# Patient Record
Sex: Male | Born: 1945 | Race: Black or African American | Hispanic: No | Smoking: Former smoker
Health system: Southern US, Community
[De-identification: ages and names within clinical notes are randomized; demographics above are authoritative.]

## PROBLEM LIST (undated history)

## (undated) DIAGNOSIS — I10 Essential (primary) hypertension: Secondary | ICD-10-CM

## (undated) DIAGNOSIS — I5189 Other ill-defined heart diseases: Secondary | ICD-10-CM

## (undated) DIAGNOSIS — E559 Vitamin D deficiency, unspecified: Secondary | ICD-10-CM

## (undated) DIAGNOSIS — G4733 Obstructive sleep apnea (adult) (pediatric): Secondary | ICD-10-CM

## (undated) DIAGNOSIS — R7303 Prediabetes: Secondary | ICD-10-CM

## (undated) DIAGNOSIS — E039 Hypothyroidism, unspecified: Secondary | ICD-10-CM

## (undated) DIAGNOSIS — M549 Dorsalgia, unspecified: Secondary | ICD-10-CM

## (undated) DIAGNOSIS — N189 Chronic kidney disease, unspecified: Secondary | ICD-10-CM

## (undated) DIAGNOSIS — D509 Iron deficiency anemia, unspecified: Secondary | ICD-10-CM

## (undated) DIAGNOSIS — Z6834 Body mass index (BMI) 34.0-34.9, adult: Secondary | ICD-10-CM

## (undated) DIAGNOSIS — N529 Male erectile dysfunction, unspecified: Secondary | ICD-10-CM

## (undated) DIAGNOSIS — N281 Cyst of kidney, acquired: Secondary | ICD-10-CM

## (undated) DIAGNOSIS — C61 Malignant neoplasm of prostate: Secondary | ICD-10-CM

## (undated) DIAGNOSIS — I350 Nonrheumatic aortic (valve) stenosis: Secondary | ICD-10-CM

## (undated) DIAGNOSIS — B351 Tinea unguium: Secondary | ICD-10-CM

## (undated) DIAGNOSIS — I4430 Unspecified atrioventricular block: Secondary | ICD-10-CM

## (undated) DIAGNOSIS — I119 Hypertensive heart disease without heart failure: Secondary | ICD-10-CM

## (undated) DIAGNOSIS — H348192 Central retinal vein occlusion, unspecified eye, stable: Secondary | ICD-10-CM

## (undated) DIAGNOSIS — T7840XA Allergy, unspecified, initial encounter: Secondary | ICD-10-CM

## (undated) HISTORY — DX: Obstructive sleep apnea (adult) (pediatric): G47.33

## (undated) HISTORY — DX: Vitamin D deficiency, unspecified: E55.9

## (undated) HISTORY — DX: Hypertensive heart disease without heart failure: I11.9

## (undated) HISTORY — DX: Allergy, unspecified, initial encounter: T78.40XA

## (undated) HISTORY — DX: Essential (primary) hypertension: I10

## (undated) HISTORY — DX: Nonrheumatic aortic (valve) stenosis: I35.0

## (undated) HISTORY — DX: Other ill-defined heart diseases: I51.89

## (undated) HISTORY — DX: Body mass index (BMI) 34.0-34.9, adult: Z68.34

## (undated) HISTORY — DX: Dorsalgia, unspecified: M54.9

## (undated) HISTORY — DX: Chronic kidney disease, unspecified: N18.9

## (undated) HISTORY — DX: Hypothyroidism, unspecified: E03.9

## (undated) HISTORY — PX: KNEE SURGERY: SHX244

## (undated) HISTORY — DX: Central retinal vein occlusion, unspecified eye, stable: H34.8192

## (undated) HISTORY — DX: Cyst of kidney, acquired: N28.1

## (undated) HISTORY — DX: Tinea unguium: B35.1

## (undated) HISTORY — DX: Iron deficiency anemia, unspecified: D50.9

## (undated) HISTORY — DX: Male erectile dysfunction, unspecified: N52.9

## (undated) HISTORY — PX: OTHER SURGICAL HISTORY: SHX169

## (undated) HISTORY — DX: Prediabetes: R73.03

## (undated) HISTORY — DX: Unspecified atrioventricular block: I44.30

## (undated) HISTORY — PX: ANKLE SURGERY: SHX546

## (undated) HISTORY — DX: Malignant neoplasm of prostate: C61

## (undated) HISTORY — PX: FOOT SURGERY: SHX648

---

## 2019-05-13 HISTORY — PX: PACEMAKER IMPLANT: EP1218

## 2019-05-22 LAB — HM DIABETES EYE EXAM

## 2020-07-21 ENCOUNTER — Telehealth: Payer: Self-pay

## 2020-07-21 NOTE — Telephone Encounter (Signed)
NOTES ON FILE FROM [facility], SENT NOTES TO REFERRAL

## 2020-07-29 ENCOUNTER — Other Ambulatory Visit: Payer: Self-pay

## 2020-07-29 ENCOUNTER — Encounter: Payer: Self-pay | Admitting: Internal Medicine

## 2020-07-29 ENCOUNTER — Ambulatory Visit: Payer: Medicare Other | Admitting: Internal Medicine

## 2020-07-29 VITALS — BP 146/72 | HR 68 | Ht 76.0 in | Wt 296.2 lb

## 2020-07-29 DIAGNOSIS — R0989 Other specified symptoms and signs involving the circulatory and respiratory systems: Secondary | ICD-10-CM

## 2020-07-29 DIAGNOSIS — I441 Atrioventricular block, second degree: Secondary | ICD-10-CM

## 2020-07-29 DIAGNOSIS — I10 Essential (primary) hypertension: Secondary | ICD-10-CM

## 2020-07-29 LAB — CUP PACEART INCLINIC DEVICE CHECK
Battery Remaining Longevity: 139 mo
Battery Voltage: 3.02 V
Brady Statistic RA Percent Paced: 23 %
Brady Statistic RV Percent Paced: 9.8 %
Date Time Interrogation Session: 20210908134353
Implantable Lead Implant Date: 20200622
Implantable Lead Implant Date: 20200622
Implantable Lead Location: 753859
Implantable Lead Location: 753860
Implantable Pulse Generator Implant Date: 20200622
Lead Channel Impedance Value: 387.5 Ohm
Lead Channel Impedance Value: 637.5 Ohm
Lead Channel Pacing Threshold Amplitude: 0.625 V
Lead Channel Pacing Threshold Amplitude: 0.75 V
Lead Channel Pacing Threshold Pulse Width: 0.4 ms
Lead Channel Pacing Threshold Pulse Width: 0.5 ms
Lead Channel Sensing Intrinsic Amplitude: 2.7 mV
Lead Channel Sensing Intrinsic Amplitude: 5.8 mV
Lead Channel Setting Pacing Amplitude: 0.875
Lead Channel Setting Pacing Amplitude: 1.75 V
Lead Channel Setting Pacing Pulse Width: 0.5 ms
Lead Channel Setting Sensing Sensitivity: 2 mV
Pulse Gen Model: 2272
Pulse Gen Serial Number: 3311922

## 2020-07-29 NOTE — Patient Instructions (Addendum)
Medication Instructions:  Your physician recommends that you continue on your current medications as directed. Please refer to the Current Medication list given to you today.  *If you need a refill on your cardiac medications before your next appointment, please call your pharmacy*  Lab Work: None ordered.  If you have labs (blood work) drawn today and your tests are completely normal, you will receive your results only by: Marland Kitchen MyChart Message (if you have MyChart) OR . A paper copy in the mail If you have any lab test that is abnormal or we need to change your treatment, we will call you to review the results.  Testing/Procedures: Your physician has requested that you have a carotid duplex. This test is an ultrasound of the carotid arteries in your neck. It looks at blood flow through these arteries that supply the brain with blood. Allow one hour for this exam. There are no restrictions or special instructions.  Please schedule for carotid dopplers at [facility]    Follow-Up: At [facility], you and your health needs are our priority.  As part of our continuing mission to provide you with exceptional heart care, we have created designated Provider Care Teams.  These Care Teams include your primary Cardiologist (physician) and Advanced Practice Providers (APPs -  Physician Assistants and Nurse Practitioners) who all work together to provide you with the care you need, when you need it.  We recommend signing up for the patient portal called "MyChart".  Sign up information is provided on this After Visit Summary.  MyChart is used to connect with patients for Virtual Visits (Telemedicine).  Patients are able to view lab/test results, encounter notes, upcoming appointments, etc.  Non-urgent messages can be sent to your provider as well.   To learn more about what you can do with MyChart, go to [email or web address].    Your next appointment:   Your physician wants you to follow-up in: 3  months with Richardson Dopp and 1 year with Dr. Rayann Heman. You will receive a reminder letter in the mail two months in advance. If you don't receive a letter, please call our office to schedule the follow-up appointment.    Other Instructions:

## 2020-07-29 NOTE — Progress Notes (Signed)
Jared Chaney, No Pcp Per: Primary Cardiologist:  Previously Dr Carmela Rima in [place] is a 74 y.o. male with a h/o bradycardia sp PPM (SJM) in [place] on 05/13/19 who presents today to establish care in the Electrophysiology device clinic.  He reports having transient presyncope 1 week post TAVR with transient AV block documented to be the cause.  He underwent emergent PPM implant. The Jared Chaney reports doing very well since having a pacemaker implanted and remains very active despite his age.  He is has chronic diastolic dysfunction and is also s/p TAVR.    He has venous insuffiency/ edema.   Today, he  denies symptoms of palpitations, chest pain, shortness of breath, orthopnea, PND,  dizziness, presyncope, syncope, or neurologic sequela.  The patientis tolerating medications without difficulties and is otherwise without complaint today.   Past Medical History:  Diagnosis Date  . Allergies   . Aortic stenosis    s/p TAVR  . AV block    s/p PPM  . Back pain   . Body mass index (BMI) 34.0-34.9, adult   . Borderline diabetes   . CKD (chronic kidney disease)   . Diastolic dysfunction   . Erectile dysfunction   . Hypertension   . Hypothyroidism   . Iron deficiency anemia   . Kidney cysts   . LVH (left ventricular hypertrophy) due to hypertensive disease   . Onychomycosis of toenail   . OSA (obstructive sleep apnea)    not compliant with CPAP  . Prostate cancer ([place])   . Retinal vein occlusion   . Vitamin D deficiency    Past Surgical History:  Procedure Laterality Date  . ANKLE SURGERY    . FOOT SURGERY Right   . GASTRIC BY PASS    . KNEE SURGERY    . PACEMAKER IMPLANT Left 05/13/2019   SJM Assurity MRI model 2272 PPM implanted in [place] by Dr Emeline General for "AV block"  . SKIN TRANSPLANTATION    . TAVR      Social History   Socioeconomic History  . Marital status: Widowed    Spouse name: Not on file  . Number of children: Not on file  . Years of education: Not on  file  . Highest education level: Not on file  Occupational History  . Not on file  Tobacco Use  . Smoking status: Former Smoker    Packs/day: 0.50    Years: 24.00    Pack years: 12.00    Types: Cigarettes    Start date: 63    Quit date: 1984    Years since quitting: 37.7  . Smokeless tobacco: Never Used  Substance and Sexual Activity  . Alcohol use: Yes    Comment: 6pk per week  . Drug use: Not on file  . Sexual activity: Not on file  Other Topics Concern  . Not on file  Social History Narrative   Moved from [place] to  July 2021 to be near his son   Widowed (2015)   3 biological children and 7 adopted   Retired from [employer]   Social Determinants of Radio broadcast assistant Strain:   . Difficulty of Paying Living Expenses: Not on file  Food Insecurity:   . Worried About Charity fundraiser in the Last Year: Not on file  . Ran Out of Food in the Last Year: Not on file  Transportation Needs:   . Lack of Transportation (Medical): Not on file  .  Lack of Transportation (Non-Medical): Not on file  Physical Activity:   . Days of Exercise per Week: Not on file  . Minutes of Exercise per Session: Not on file  Stress:   . Feeling of Stress : Not on file  Social Connections:   . Frequency of Communication with Friends and Family: Not on file  . Frequency of Social Gatherings with Friends and Family: Not on file  . Attends Religious Services: Not on file  . Active Member of Clubs or Organizations: Not on file  . Attends Archivist Meetings: Not on file  . Marital Status: Not on file  Intimate Partner Violence:   . Fear of Current or Ex-Partner: Not on file  . Emotionally Abused: Not on file  . Physically Abused: Not on file  . Sexually Abused: Not on file    Family History  Problem Relation Age of Onset  . Hypertension Mother   . Heart attack Mother   . Hypertension Father   . Renal Disease Father   . CVA Other     Allergies  Allergen  Reactions  . Codeine     Current Outpatient Medications  Medication Sig Dispense Refill  . amLODipine (NORVASC) 10 MG tablet Take 10 mg by mouth daily.    Marland Kitchen amoxicillin (AMOXIL) 500 MG tablet Take 500 mg by mouth 2 (two) times daily.    Marland Kitchen aspirin EC 81 MG tablet Take 81 mg by mouth daily. Swallow whole.    . benazepril-hydrochlorthiazide (LOTENSIN HCT) 20-25 MG tablet Take 1 tablet by mouth daily.    . bicalutamide (CASODEX) 50 MG tablet Take 50 mg by mouth daily.    . fexofenadine (ALLEGRA) 180 MG tablet Take 180 mg by mouth as needed for allergies or rhinitis.    . fluticasone (FLONASE) 50 MCG/ACT nasal spray Place 1 spray into the nose in the morning and at bedtime.    Marland Kitchen FLUTICASONE PROPIONATE EX Apply topically.    Marland Kitchen levothyroxine (SYNTHROID) 75 MCG tablet Take 75 mcg by mouth daily before breakfast.    . omeprazole (PRILOSEC) 20 MG capsule Take 20 mg by mouth daily.    . Selenium 200 MCG CAPS Take 1 capsule by mouth daily.    . sildenafil (VIAGRA) 50 MG tablet Take 50 mg by mouth daily as needed for erectile dysfunction.    Marland Kitchen Specialty Vitamins Products [facility]) CAPS Take by mouth.    Marland Kitchen Specialty Vitamins Products (PROSTATE PO) Take by mouth.    . thiamine 100 MG tablet Take 100 mg by mouth daily.     No current facility-administered medications for this visit.    ROS- all systems are reviewed and negative except as per HPI  Physical Exam: Vitals:   07/29/20 1305  BP: (!) 146/72  Pulse: 68  SpO2: 96%  Weight: 296 lb 3.2 oz (134.4 kg)  Height: 6\' 4"  (1.93 m)    GEN- The Jared Chaney is well appearing, alert and oriented x 3 today.   Head- normocephalic, atraumatic Eyes-  Poor vision in R eye Ears- hearing intact Oropharynx- clear Neck- supple,   + bilateral bruits Lungs-   normal work of breathing Chest- pacemaker pocket is well healed Heart- Regular rate and rhythm  GI- soft  Extremities- no clubbing, cyanosis, or edema MS- no significant deformity or  atrophy Skin- no rash or lesion Psych- euthymic mood, full affect Neuro- strength and sensation are intact  Pacemaker interrogation- reviewed in detail today,  See PACEART report  Echo 01/23/20-  EF 50-55%, mild LVH, mild MR/TR/PR/AI, s/p TAVR  Assessment and Plan:  1. Second degree AV block Normal pacemaker function See paceArt  2. Chronic diastolic dysfunction Doing well No changes  3. S/p TAVR Stable No change required today  4. HTN Stable No change required today  5. OSA Uses CPAP rarely  6. Obesity Body mass index is 36.05 kg/m. S/p gastric bypass  7. Venous insuffiency Stable No change required today  8. Carotid bruits (bilateral) Obtain carotid dopplers  Return to see general cardiology team Richardson Dopp) every 3 months I will follow remotely and see in a year

## 2020-07-30 NOTE — Addendum Note (Signed)
Addended by: Rose Phi on: 07/30/2020 04:40 PM   Modules accepted: Orders

## 2020-08-11 ENCOUNTER — Ambulatory Visit (HOSPITAL_COMMUNITY)
Admission: RE | Admit: 2020-08-11 | Discharge: 2020-08-11 | Disposition: A | Discharge status: Home / Self Care | Payer: Medicare Other | Source: Ambulatory Visit | Attending: Cardiovascular Disease | Admitting: Cardiovascular Disease

## 2020-08-11 ENCOUNTER — Other Ambulatory Visit: Payer: Self-pay

## 2020-08-11 DIAGNOSIS — I10 Essential (primary) hypertension: Secondary | ICD-10-CM | POA: Insufficient documentation

## 2020-08-11 DIAGNOSIS — R0989 Other specified symptoms and signs involving the circulatory and respiratory systems: Secondary | ICD-10-CM | POA: Diagnosis not present

## 2020-08-11 DIAGNOSIS — I441 Atrioventricular block, second degree: Secondary | ICD-10-CM

## 2020-08-17 ENCOUNTER — Telehealth: Payer: Self-pay | Admitting: Internal Medicine

## 2020-08-17 NOTE — Telephone Encounter (Signed)
New Message  Pt is returning call for results   Call transferred to [facility]

## 2020-08-21 ENCOUNTER — Ambulatory Visit: Payer: Self-pay | Admitting: Family

## 2020-10-22 ENCOUNTER — Other Ambulatory Visit: Payer: Self-pay | Admitting: Urology

## 2020-10-22 ENCOUNTER — Other Ambulatory Visit (HOSPITAL_COMMUNITY): Payer: Self-pay | Admitting: Urology

## 2020-10-22 DIAGNOSIS — C7951 Secondary malignant neoplasm of bone: Secondary | ICD-10-CM

## 2020-10-22 DIAGNOSIS — C61 Malignant neoplasm of prostate: Secondary | ICD-10-CM

## 2020-10-22 DIAGNOSIS — C778 Secondary and unspecified malignant neoplasm of lymph nodes of multiple regions: Secondary | ICD-10-CM

## 2020-10-26 ENCOUNTER — Other Ambulatory Visit: Payer: Self-pay | Admitting: Family

## 2020-10-26 ENCOUNTER — Ambulatory Visit
Admission: RE | Admit: 2020-10-26 | Discharge: 2020-10-26 | Disposition: A | Discharge status: Home / Self Care | Payer: Medicare Other | Source: Ambulatory Visit | Attending: Family | Admitting: Family

## 2020-10-26 DIAGNOSIS — M25562 Pain in left knee: Secondary | ICD-10-CM
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
## 2020-10-28 ENCOUNTER — Other Ambulatory Visit: Payer: Self-pay

## 2020-10-28 ENCOUNTER — Ambulatory Visit (INDEPENDENT_AMBULATORY_CARE_PROVIDER_SITE_OTHER): Payer: Medicare Other

## 2020-10-28 ENCOUNTER — Encounter: Payer: Self-pay | Admitting: Physician Assistant

## 2020-10-28 ENCOUNTER — Ambulatory Visit: Payer: Medicare Other | Admitting: Physician Assistant

## 2020-10-28 VITALS — BP 100/42 | HR 70 | Ht 76.0 in | Wt 299.0 lb

## 2020-10-28 DIAGNOSIS — I5032 Chronic diastolic (congestive) heart failure: Secondary | ICD-10-CM

## 2020-10-28 DIAGNOSIS — N1831 Chronic kidney disease, stage 3a: Secondary | ICD-10-CM

## 2020-10-28 DIAGNOSIS — Z952 Presence of prosthetic heart valve: Secondary | ICD-10-CM

## 2020-10-28 DIAGNOSIS — I441 Atrioventricular block, second degree: Secondary | ICD-10-CM | POA: Diagnosis not present

## 2020-10-28 DIAGNOSIS — I350 Nonrheumatic aortic (valve) stenosis: Secondary | ICD-10-CM

## 2020-10-28 DIAGNOSIS — I2510 Atherosclerotic heart disease of native coronary artery without angina pectoris: Secondary | ICD-10-CM | POA: Diagnosis not present

## 2020-10-28 DIAGNOSIS — I10 Essential (primary) hypertension: Secondary | ICD-10-CM | POA: Diagnosis not present

## 2020-10-28 DIAGNOSIS — Z950 Presence of cardiac pacemaker: Secondary | ICD-10-CM | POA: Diagnosis not present

## 2020-10-28 NOTE — Patient Instructions (Signed)
Medication Instructions:  Your physician recommends that you continue on your current medications as directed. Please refer to the Current Medication list given to you today.  *If you need a refill on your cardiac medications before your next appointment, please call your pharmacy*  Lab Work: None ordered today  Testing/Procedures: None ordered today  Follow-Up: At [facility], you and your health needs are our priority.  As part of our continuing mission to provide you with exceptional heart care, we have created designated Provider Care Teams.  These Care Teams include your primary Cardiologist (physician) and Advanced Practice Providers (APPs -  Physician Assistants and Nurse Practitioners) who all work together to provide you with the care you need, when you need it.  Your next appointment:   6 month(s)  The format for your next appointment:   In Person  Provider:   Scott Weaver, PA-C   

## 2020-10-29 LAB — CUP PACEART REMOTE DEVICE CHECK
Battery Remaining Longevity: 133 mo
Battery Remaining Percentage: 95.5 %
Battery Voltage: 3.02 V
Brady Statistic AP VP Percent: 5.6 %
Brady Statistic AP VS Percent: 17 %
Brady Statistic AS VP Percent: 5.2 %
Brady Statistic AS VS Percent: 71 %
Brady Statistic RA Percent Paced: 21 %
Brady Statistic RV Percent Paced: 11 %
Date Time Interrogation Session: 20211208040012
Implantable Lead Implant Date: 20200622
Implantable Lead Implant Date: 20200622
Implantable Lead Location: 753859
Implantable Lead Location: 753860
Implantable Pulse Generator Implant Date: 20200622
Lead Channel Impedance Value: 390 Ohm
Lead Channel Impedance Value: 590 Ohm
Lead Channel Pacing Threshold Amplitude: 0.625 V
Lead Channel Pacing Threshold Amplitude: 0.75 V
Lead Channel Pacing Threshold Pulse Width: 0.4 ms
Lead Channel Pacing Threshold Pulse Width: 0.5 ms
Lead Channel Sensing Intrinsic Amplitude: 2.3 mV
Lead Channel Sensing Intrinsic Amplitude: 6.3 mV
Lead Channel Setting Pacing Amplitude: 0.875
Lead Channel Setting Pacing Amplitude: 1.75 V
Lead Channel Setting Pacing Pulse Width: 0.5 ms
Lead Channel Setting Sensing Sensitivity: 2 mV
Pulse Gen Model: 2272
Pulse Gen Serial Number: 3311922

## 2020-11-04 ENCOUNTER — Encounter (HOSPITAL_COMMUNITY): Payer: Medicare Other

## 2020-11-04 ENCOUNTER — Ambulatory Visit (HOSPITAL_COMMUNITY): Payer: Medicare Other

## 2020-11-04 ENCOUNTER — Ambulatory Visit: Payer: Medicare Other

## 2020-11-06 ENCOUNTER — Ambulatory Visit (HOSPITAL_COMMUNITY)
Admission: RE | Admit: 2020-11-06 | Discharge: 2020-11-06 | Disposition: A | Discharge status: Home / Self Care | Payer: Medicare Other | Source: Ambulatory Visit | Attending: Urology | Admitting: Urology

## 2020-11-06 ENCOUNTER — Other Ambulatory Visit: Payer: Self-pay

## 2020-11-06 DIAGNOSIS — C7951 Secondary malignant neoplasm of bone: Secondary | ICD-10-CM | POA: Diagnosis present

## 2020-11-06 DIAGNOSIS — C61 Malignant neoplasm of prostate: Secondary | ICD-10-CM | POA: Diagnosis present

## 2020-11-06 DIAGNOSIS — C778 Secondary and unspecified malignant neoplasm of lymph nodes of multiple regions: Secondary | ICD-10-CM | POA: Diagnosis present

## 2020-11-06 MED ORDER — TECHNETIUM TC 99M MEDRONATE IV KIT
21.7000 | PACK | Freq: Once | INTRAVENOUS | Status: AC
  Administered 2020-11-06: 21.7 via INTRAVENOUS

## 2020-11-09 ENCOUNTER — Ambulatory Visit (HOSPITAL_COMMUNITY): Payer: Medicare Other

## 2020-11-09 ENCOUNTER — Other Ambulatory Visit (HOSPITAL_COMMUNITY): Payer: Medicare Other

## 2020-11-10 NOTE — Progress Notes (Signed)
Remote pacemaker transmission.   

## 2020-11-19 ENCOUNTER — Other Ambulatory Visit (HOSPITAL_COMMUNITY): Payer: Medicare Other

## 2020-11-19 ENCOUNTER — Ambulatory Visit (HOSPITAL_COMMUNITY): Payer: Medicare Other

## 2020-11-25 ENCOUNTER — Ambulatory Visit: Payer: Medicare Other | Admitting: Family Medicine

## 2020-11-25 ENCOUNTER — Encounter: Payer: Self-pay | Admitting: Family Medicine

## 2020-11-25 ENCOUNTER — Other Ambulatory Visit: Payer: Self-pay

## 2020-11-25 VITALS — BP 152/100 | Ht 76.0 in | Wt 298.0 lb

## 2020-11-25 DIAGNOSIS — M6530 Trigger finger, unspecified finger: Secondary | ICD-10-CM | POA: Diagnosis not present

## 2020-11-25 DIAGNOSIS — M1711 Unilateral primary osteoarthritis, right knee: Secondary | ICD-10-CM | POA: Diagnosis not present

## 2020-11-25 MED ORDER — METHYLPREDNISOLONE ACETATE 40 MG/ML IJ SUSP
40.0000 mg | Freq: Once | INTRAMUSCULAR | Status: AC
  Administered 2020-11-25: 40 mg via INTRA_ARTICULAR

## 2020-11-25 MED ORDER — METHYLPREDNISOLONE ACETATE 40 MG/ML IJ SUSP
20.0000 mg | Freq: Once | INTRAMUSCULAR | Status: AC
  Administered 2020-11-25: 20 mg via INTRA_ARTICULAR

## 2020-11-25 NOTE — Progress Notes (Addendum)
PCP: [name], FNP  Subjective:   HPI: Patient is a 75 y.o. male with history of obesity status post bariatric surgery, known bilateral knee osteoarthritis, distant history of left medial meniscus surgery who presents here for evaluation of left knee pain and left finger pain.  Knee pain: Patient reports that he has known arthritis in his knee, and has had corticosteroid injection in the past, however over the last few months, it has progressively worsened.  He noted that it was hurting more the day after he was working out in [employer], though is not aware of any specific exercise that cause pain.  The pain is on the medial aspect of his knee around the medial joint line.  Aggravated by deep flexion or prolonged walking.  He does feel an intermittent catching sensation, but no true mechanical symptoms.  No numbness or tingling, no weakness.  He has intermittently taken Tylenol though does not like taking very many medications.  He was seen by his PCP for this issue, x-rays were obtained and showed severe medial compartmental arthritis.  Left fourth digit pain: Patient has noticed over the last year or so that his left fourth digit will get stuck in the flexed position.  Is then painful to try to straighten out eventually it will after force.  Sometimes he has to use his other hand to straighten out his finger and feels a popping sensation.  He has not had anything happen like this before.  He has not have any numbness or tingling or weakness that he is aware of.   Review of Systems:  Per HPI.   [place], medications and smoking status reviewed.      Objective:  Physical Exam:  No flowsheet data found.   Gen: awake, alert, NAD, comfortable in exam room Pulm: breathing unlabored  L Knee  Inspection: Bilateral knees without evidence of erythema, ecchymosis, swelling, edema. No significant effusion present  Active ROM: Intact. 0-140d with pain with deep flexion.  Strength: 5/5  strength to resisted flexion/extension without pain  Patella: Negative patellar grind. No patellar facet tenderness. No apprehension. No proximal or distal patellar tendon tenderness to palpation. No quad tendon tenderness to palpation.  Tibia: No tibial plateau, tibial tuberosity tenderness.  Joint line: + Medial joint line tenderness, mild Popliteal: No popliteal tenderness to the insertional gastroc. No insertional biceps femoris, semimembranosis, semitendinosis tenderness.  McMurrays/Thessaly's: Mildly painful along the medial joint line Lachmans: Stable bilaterally with firm endpoint  Anterior/Posterior drawer: Stable bilaterally Varus/valgus stress at 0, 15d: Negative for pain, laxity  Left fourth digit: Inspection: No obvious ecchymoses, edema or erythema Palpation: Tenderness to palpation just proximal to the fourth MCP joint with a palpable nodule present ROM: Full range of motion with flexion extension of the finger with reproduction of triggering on extension. Strength: 5/5 strength with flexion extension of the finger  Limited ultrasound examination of the left fourth digit shows mild enlargement of the flexor tendon just proximal to the MCP joint of the left fourth digit.    Assessment & Plan:  1.  Left knee medial compartmental osteoarthritis Patient with severe medial compartmental osteoarthritis and mild to moderate tricompartmental osteoarthritis.  Suspect that the medial compartment is exacerbated due to his history of meniscal surgery.  Discussed these findings with patient and discussed options including physical therapy, corticosteroid injection, viscosupplementation, surgery.  He would like to avoid surgery if at all possible.  Plan: -Corticosteroid injection performed today without complication -Home exercise plan given for  quadricep strengthening -Follow-up in 1 to 2 months for reevaluation  2.  Left fourth digit trigger finger Signs symptoms most consistent with  trigger finger and enlargement of the tendon visualized on ultrasound.  We discussed that corticosteroid injection or bracing would be the first step.  He elected for corticosteroid injection today which was performed without complication.  We will follow up in 1 to 2 months for reevaluation.  Procedure note: After informed written consent timeout was performed, patient was seated on exam table.  Left knee was prepped with alcohol swab and utilizing anteromedial approach, patient's left knee was injected intraarticularly with 3:1 bupivicaine: depomedrol. Patient tolerated the procedure well without immediate complications.  Procedure note: Verbal consent was obtained. Risks (including rare risk of infection, potential risk for skin lightening and potential atrophy), benefits and alternatives were discussed. Prepped with Chloraprep and Ethyl Chloride used for anesthesia. Under sterile conditions, ultrasound was used to identify the thickened tendon, and patient injected in this area.  This is all well visualized on ultrasound, please see associated documentation for details.   Needle size: 22 gauge 1 1/2 inch Injection: 1/2 cc of Lidocaine 1% and Depo-Medrol 20 mg   Jared Ligas, MD [facility] Fellow 11/25/2020 2:55 PM  Addendum:  Patient seen in the office by fellow.  His history, exam, plan of care were precepted with me.  Jared Lemon MD Kirt Boys

## 2020-11-25 NOTE — Addendum Note (Signed)
Addended by: Jolinda Croak E on: 11/25/2020 04:37 PM   Modules accepted: Orders

## 2021-01-01 ENCOUNTER — Encounter: Payer: Self-pay | Admitting: Family Medicine

## 2021-01-01 ENCOUNTER — Ambulatory Visit: Payer: Medicare Other | Admitting: Family Medicine

## 2021-01-01 ENCOUNTER — Other Ambulatory Visit: Payer: Self-pay

## 2021-01-01 VITALS — BP 135/65 | Ht 76.0 in | Wt 290.0 lb

## 2021-01-01 DIAGNOSIS — M6530 Trigger finger, unspecified finger: Secondary | ICD-10-CM | POA: Diagnosis not present

## 2021-01-01 DIAGNOSIS — M1712 Unilateral primary osteoarthritis, left knee: Secondary | ICD-10-CM | POA: Diagnosis not present

## 2021-01-01 NOTE — Progress Notes (Signed)
   PCP: [name], FNP  Subjective:   HPI: Patient is a 75 y.o. male with known bilateral severe knee osteoarthritis here for reevaluation his knee and left fourth digit trigger finger.  I last saw him for this on 11/25/2020, at that time he was having classic left fourth digit trigger finger and worsening left knee osteoarthritis.  He received a trigger finger injection and left knee corticosteroid injection.  Trigger finger, left fourth digit Patient reports he has had significant improvement in his symptoms, and in fact is not having any triggering or pain anymore.  Feels back to normal.  No numbness or tingling, no weakness, no new concerns.  Left knee osteoarthritis Patient with known bilateral knee osteoarthritis, most severe in the medial compartments.  At last visit, he was having primarily medial knee pain.  After the corticosteroid injection, he reports that he did have significant improvement for very brief time (1 to 2 weeks) but the pain recurred.  He has been trying to stay active and walking but he is unable to do much weightbearing activity due to the pain.  He has not had any new trauma or injury since the last visit.  He does note that he has had success with hyaluronic acid injections in his right knee and wonders if he can have this on the left.  Review of Systems:  Per HPI.   [place], medications and smoking status reviewed.      Objective:  Physical Exam:  No flowsheet data found.   Gen: awake, alert, NAD, comfortable in exam room Pulm: breathing unlabored  L Knee  Inspection: Bilateral knees without evidence of erythema, ecchymosis, swelling, edema. No significant effusion present  Active ROM: Intact. 0-140d with pain with deep flexion.  Strength: 5/5 strength to resisted flexion/extension without pain  Patella: Negative patellar grind. No patellar facet tenderness. No apprehension. No proximal or distal patellar tendon tenderness to palpation. No quad tendon  tenderness to palpation.  Tibia: No tibial plateau, tibial tuberosity tenderness.  Joint line: + Medial joint line tenderness, mild Popliteal: No popliteal tenderness to the insertional gastroc. No insertional biceps femoris, semimembranosis, semitendinosis tenderness.  McMurrays/Thessaly's: Mildly painful along the medial joint line  Left fourth digit: Inspection: No obvious ecchymoses, edema or erythema Palpation: Nontender to palpation, no palpable nodule. ROM: Full range of motion with flexion extension of the finger without triggering. Strength: 5/5 strength with flexion extension of the finger   Assessment & Plan:  1.  Primary localized left knee medial compartmental osteoarthritis Patient with continued symptoms despite corticosteroid injection and conservative therapy with quad strengthening exercises.  He is wondering about viscosupplementation.  We discussed that there is a highly variable response, some people having benefit some people having no benefit at all.  Patient voiced understanding and would like to proceed.  Given failure of other conservative therapies, along with his past success with viscosupplementation on the right knee, we will plan to proceed with viscosupplementation.  We will contact his insurance company for prior authorization and have him back once this is authorized to start viscosupplementation.  Dagoberto Ligas, MD [facility] 01/01/2021 9:24 AM

## 2021-01-13 ENCOUNTER — Encounter: Payer: Self-pay | Admitting: Family Medicine

## 2021-01-13 ENCOUNTER — Other Ambulatory Visit: Payer: Self-pay

## 2021-01-13 ENCOUNTER — Ambulatory Visit: Payer: Medicare Other | Admitting: Family Medicine

## 2021-01-13 VITALS — BP 128/64 | Ht 76.0 in | Wt 290.0 lb

## 2021-01-13 DIAGNOSIS — M1712 Unilateral primary osteoarthritis, left knee: Secondary | ICD-10-CM

## 2021-01-13 NOTE — Progress Notes (Addendum)
   PCP: [name], FNP  Subjective:   HPI: Patient is a 75 y.o. male with history of obesity status post bariatric surgery, known bilateral knee osteoarthritis, distant history of left medial meniscus surgery who presents here for  left knee viscosupplementation injection.  He reports that his knee pain is unchanged since his previous visit on 11/25/2020.  He has no new concerns and would still like to proceed with viscosupplementation today.  Review of Systems:  Per HPI.   [place], medications and smoking status reviewed.      Objective:  Physical Exam:  No flowsheet data found.   Gen: awake, alert, NAD, comfortable in exam room Pulm: breathing unlabored  L Knee  Inspection: Bilateral knees without evidence of erythema, ecchymosis, swelling, edema. No significant effusion present  Active ROM: Intact. 0-140d with pain with deep flexion.  Strength: 5/5 strength to resisted flexion/extension without pain  Joint line: + Medial joint line tenderness Lachmans: Stable bilaterally with firm endpoint  Anterior/Posterior drawer: Stable bilaterally Varus/valgus stress at 0, 15d: Negative for pain, laxity   Assessment & Plan:  1.  Left knee osteoarthritis  Patient presents for viscosupplementation injection.  This was performed today under ultrasound guidance successfully.  We will plan to follow-up as needed based on symptoms.  Discussed that he should give this at least a month to evaluate its effectiveness.  Procedure note: Patient is clinical status is marked by significant pain and/or functional disability.  Other conservative therapies have not provide adequate relief or not indicated.  Risks and benefits of left knee viscosupplementation injection have been discussed with the patient and he elected to proceed.  Timeout was called prior to the procedure and correct location was identified.  Patient was then cleaned with alcohol swab, and sterile ultrasound technique was used to  identify the suprapatellar pouch.  He was anesthetized with 3 cc of lidocaine, and injected with preloaded Durolane syringe.  This is all well visualized under ultrasound guidance.   Dagoberto Ligas, MD [facility] 01/13/2021 3:52 PM  Addendum:  I was the preceptor for this visit and available for immediate consultation.  Karlton Lemon MD Kirt Boys

## 2021-01-27 ENCOUNTER — Ambulatory Visit (INDEPENDENT_AMBULATORY_CARE_PROVIDER_SITE_OTHER): Payer: Medicare Other

## 2021-01-27 DIAGNOSIS — I441 Atrioventricular block, second degree: Secondary | ICD-10-CM | POA: Diagnosis not present

## 2021-01-29 LAB — CUP PACEART REMOTE DEVICE CHECK
Battery Remaining Longevity: 132 mo
Battery Remaining Percentage: 95.5 %
Battery Voltage: 3.01 V
Brady Statistic AP VP Percent: 8.5 %
Brady Statistic AP VS Percent: 19 %
Brady Statistic AS VP Percent: 5.3 %
Brady Statistic AS VS Percent: 67 %
Brady Statistic RA Percent Paced: 25 %
Brady Statistic RV Percent Paced: 14 %
Date Time Interrogation Session: 20220309040027
Implantable Lead Implant Date: 20200622
Implantable Lead Implant Date: 20200622
Implantable Lead Location: 753859
Implantable Lead Location: 753860
Implantable Pulse Generator Implant Date: 20200622
Lead Channel Impedance Value: 390 Ohm
Lead Channel Impedance Value: 590 Ohm
Lead Channel Pacing Threshold Amplitude: 0.625 V
Lead Channel Pacing Threshold Amplitude: 0.875 V
Lead Channel Pacing Threshold Pulse Width: 0.4 ms
Lead Channel Pacing Threshold Pulse Width: 0.5 ms
Lead Channel Sensing Intrinsic Amplitude: 2.6 mV
Lead Channel Sensing Intrinsic Amplitude: 5.6 mV
Lead Channel Setting Pacing Amplitude: 0.875
Lead Channel Setting Pacing Amplitude: 1.875
Lead Channel Setting Pacing Pulse Width: 0.5 ms
Lead Channel Setting Sensing Sensitivity: 2 mV
Pulse Gen Model: 2272
Pulse Gen Serial Number: 3311922

## 2021-02-04 NOTE — Progress Notes (Signed)
Remote pacemaker transmission.   

## 2021-04-27 NOTE — Progress Notes (Signed)
Cardiology Office Note:    Date:  04/28/2021   ID:  Jared Chaney, DOB [birth year], MRN [id number]  PCP:  Jared Brasil, FNP   [facility] Providers Cardiologist:  None Cardiology APP:  Liliane Shi, PA-C  Electrophysiologist:  Thompson Grayer, MD     Referring MD: Jared Brasil, FNP   Chief Complaint:  Follow-up (Hx of TAVR, CAD)    Patient Profile:    Jared Chaney is a 75 y.o. male with:   Heart failure with preserved ejection fraction   Aortic stenosis ? 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 SCr 1.86 (03/04/20)  Non-obs coronary artery disease (cath in 04/2019)  Diabetes mellitus   Hypothyroidism   Macular degeneration   Prostate CA  Hx of gastric bypass in 2008   Carotid artery disease  ? bilat ICA 1-39  Prior CV studies: Carotid US 08/11/20 bilat ICA 1-39  Echocardiogram 01/23/20 ([place], [place]) EF 50-55, mild LVH, no RWMA, mild AI, normal TAVR gradient, mild MR, mild TR, mild PI  Cardiac catheterization 04/22/2019 ([place], [place]) LAD mid 40 LCx prox 10 RCA no CAD   History of Present Illness: Jared Chaney returns for follow-up.  He is here alone.  He does note symptoms of exhaustion.  This usually occurs after doing laborious work.  He has noted this for quite some time.  There has not been any significant worsening.  He has not had chest pain, shortness of breath, syncope.  He does have a history of sleep apnea.  He has not been able to tolerate CPAP therapy.  He has frequent urination at night given his history of prostate cancer and does not get much sleep.        Past Medical History:  Diagnosis Date  . Allergies   . Aortic stenosis    s/p TAVR  . AV block    s/p PPM  . Back pain   . Body mass index (BMI) 34.0-34.9, adult   . Borderline diabetes   . CKD (chronic kidney disease)   . Diastolic dysfunction   . Erectile dysfunction   .  Hypertension   . Hypothyroidism   . Iron deficiency anemia   . Kidney cysts   . LVH (left ventricular hypertrophy) due to hypertensive disease   . Onychomycosis of toenail   . OSA (obstructive sleep apnea)    not compliant with CPAP  . Prostate cancer ([place])   . Retinal vein occlusion   . Vitamin D deficiency     Current Medications: Current Meds  Medication Sig  . amLODipine (NORVASC) 10 MG tablet Take 10 mg by mouth daily.  Marland Kitchen amoxicillin (AMOXIL) 500 MG tablet Take 500 mg by mouth 2 (two) times daily.  Marland Kitchen aspirin EC 81 MG tablet Take 81 mg by mouth daily. Swallow whole.  Marland Kitchen azithromycin (ZITHROMAX) 250 MG tablet Take 250 mg by mouth as needed (for protection against infections/bacteria).   . benazepril-hydrochlorthiazide (LOTENSIN HCT) 20-25 MG tablet Take 1 tablet by mouth daily.  . bicalutamide (CASODEX) 50 MG tablet Take 50 mg by mouth daily.  . fexofenadine (ALLEGRA) 180 MG tablet Take 180 mg by mouth as needed for allergies or rhinitis.  . fluticasone (FLONASE) 50 MCG/ACT nasal spray Place 1 spray into the nose in the morning and at bedtime.  Marland Kitchen FLUTICASONE PROPIONATE EX Apply topically.  Marland Kitchen levothyroxine (SYNTHROID) 75  MCG tablet Take 75 mcg by mouth daily before breakfast.  . omeprazole (PRILOSEC) 20 MG capsule Take 20 mg by mouth daily.  . Selenium 200 MCG CAPS Take 1 capsule by mouth daily.  . sildenafil (VIAGRA) 50 MG tablet Take 50 mg by mouth daily as needed for erectile dysfunction.  Marland Kitchen Specialty Vitamins Products [facility]) CAPS Take by mouth.  Marland Kitchen Specialty Vitamins Products (PROSTATE PO) Take by mouth.  . thiamine 100 MG tablet Take 100 mg by mouth daily.     Allergies:   Codeine   Social History   Tobacco Use  . Smoking status: Former Smoker    Packs/day: 0.50    Years: 24.00    Pack years: 12.00    Types: Cigarettes    Start date: 70    Quit date: 1984    Years since quitting: 38.4  . Smokeless tobacco: Never Used  Substance Use Topics  . Alcohol use:  Yes    Comment: 6pk per week     Family Hx: The patient's family history includes CVA in an other family member; Heart attack in his mother; Hypertension in his father and mother; Renal Disease in his father.  Review of Systems  Respiratory: Positive for snoring.      EKGs/Labs/Other Test Reviewed:    EKG:  EKG is not ordered today.  The ekg ordered today demonstrates n/a  Recent Labs: No results found for requested labs within last 8760 hours.   Recent Lipid Panel No results found for: CHOL, TRIG, HDL, LDLCALC, LDLDIRECT    Risk Assessment/Calculations:      Physical Exam:    VS:  BP 120/68   Pulse 75   Ht 6\' 4"  (1.93 m)   Wt 292 lb (132.5 kg)   SpO2 98%   BMI 35.54 kg/m     Wt Readings from Last 3 Encounters:  04/28/21 292 lb (132.5 kg)  01/13/21 290 lb (131.5 kg)  01/01/21 290 lb (131.5 kg)     Constitutional:      Appearance: Healthy appearance. Not in distress.  Neck:     Vascular: JVD normal.  Pulmonary:     Effort: Pulmonary effort is normal.     Breath sounds: No wheezing. No rales.  Cardiovascular:     Normal rate. Regular rhythm. Normal S1. Normal S2.     Murmurs: There is a grade 2/6 systolic murmur at the URSB.  Edema:    Ankle: bilateral trace edema of the ankle. Abdominal:     Palpations: Abdomen is soft. There is no hepatomegaly.  Skin:    General: Skin is warm and dry.  Neurological:     General: No focal deficit present.     Mental Status: Alert and oriented to person, place and time.     Cranial Nerves: Cranial nerves are intact.         ASSESSMENT & PLAN:    1. Chronic heart failure with preserved ejection fraction (HCC) Normal EF.  NYHA II.  Volume status stable.  He is only on a thiazide diuretic.  Continue current management.    2. Nonrheumatic aortic valve stenosis 3. S/P TAVR (transcatheter aortic valve replacement) Normally functioning TAVR on echocardiogram in 3/21.  Continue SBE prophylaxis.    4. Coronary artery  disease involving native coronary artery of native heart without angina pectoris Mild non-obs disease on cath in 2020 prior to TAVR.  He is not having angina.  He is fatigued.  I have asked him to contact us  if his symptoms worsen or if he has persistent symptoms despite resuming CPAP Rx (see below).  If symptoms continue or worsen, we could consider stress testing.  Continue ASA.  Obtain fasting lipids today.  Goal LDL <70.  I will have a low threshold to start statin Rx.   5. Essential hypertension The patient's blood pressure is controlled on his current regimen.  Continue current therapy.    6. Stage 3a chronic kidney disease (HCC) Recent Cr 1.5.  7. Cardiac pacemaker in situ F/u with EP as planned.   8. OSA (obstructive sleep apnea) 9. Other fatigue He has a history of sleep apnea but has been unable to tolerate CPAP therapy.  I suspect his fatigue is mainly related to this.  I will refer him to Dr. Radford Pax to see if there is anything else that can be done to help him tolerate CPAP.  I will check with his PCP to see if he has had a recent hemoglobin and TSH obtained.  If not I will obtain a CBC and TSH today.  I have asked him to continue to monitor his blood pressure at home.  If it is running low, we can cut back on some of his medicines.    Dispo:  Return in about 9 months (around 01/26/2022) for Routine Follow Up, w/ Richardson Dopp, PA-C.   Medication Adjustments/Labs and Tests Ordered: Current medicines are reviewed at length with the patient today.  Concerns regarding medicines are outlined above.  Tests Ordered: Orders Placed This Encounter  Procedures  . Lipid Profile  . TSH  . CBC  . Ambulatory referral to Sleep Studies   Medication Changes: No orders of the defined types were placed in this encounter.   Signed, Richardson Dopp, PA-C  04/28/2021 10:29 AM    [facility] [place], [place], [place]  [id number] Phone: [phone]; Fax: [phone]

## 2021-04-28 ENCOUNTER — Other Ambulatory Visit: Payer: Self-pay

## 2021-04-28 ENCOUNTER — Encounter: Payer: Self-pay | Admitting: Physician Assistant

## 2021-04-28 ENCOUNTER — Telehealth: Payer: Self-pay | Admitting: *Deleted

## 2021-04-28 ENCOUNTER — Ambulatory Visit (INDEPENDENT_AMBULATORY_CARE_PROVIDER_SITE_OTHER): Payer: Medicare Other

## 2021-04-28 ENCOUNTER — Ambulatory Visit (INDEPENDENT_AMBULATORY_CARE_PROVIDER_SITE_OTHER): Payer: Medicare Other | Admitting: Physician Assistant

## 2021-04-28 VITALS — BP 120/68 | HR 75 | Ht 76.0 in | Wt 292.0 lb

## 2021-04-28 DIAGNOSIS — G4733 Obstructive sleep apnea (adult) (pediatric): Secondary | ICD-10-CM

## 2021-04-28 DIAGNOSIS — I441 Atrioventricular block, second degree: Secondary | ICD-10-CM

## 2021-04-28 DIAGNOSIS — Z952 Presence of prosthetic heart valve: Secondary | ICD-10-CM | POA: Diagnosis not present

## 2021-04-28 DIAGNOSIS — I350 Nonrheumatic aortic (valve) stenosis: Secondary | ICD-10-CM

## 2021-04-28 DIAGNOSIS — Z950 Presence of cardiac pacemaker: Secondary | ICD-10-CM

## 2021-04-28 DIAGNOSIS — R5383 Other fatigue: Secondary | ICD-10-CM

## 2021-04-28 DIAGNOSIS — I5032 Chronic diastolic (congestive) heart failure: Secondary | ICD-10-CM | POA: Diagnosis not present

## 2021-04-28 DIAGNOSIS — I10 Essential (primary) hypertension: Secondary | ICD-10-CM

## 2021-04-28 DIAGNOSIS — I2510 Atherosclerotic heart disease of native coronary artery without angina pectoris: Secondary | ICD-10-CM | POA: Diagnosis not present

## 2021-04-28 DIAGNOSIS — N1831 Chronic kidney disease, stage 3a: Secondary | ICD-10-CM

## 2021-04-28 LAB — CUP PACEART REMOTE DEVICE CHECK
Battery Remaining Longevity: 132 mo
Battery Remaining Percentage: 95.5 %
Battery Voltage: 3.01 V
Brady Statistic AP VP Percent: 8.7 %
Brady Statistic AP VS Percent: 18 %
Brady Statistic AS VP Percent: 5.5 %
Brady Statistic AS VS Percent: 66 %
Brady Statistic RA Percent Paced: 24 %
Brady Statistic RV Percent Paced: 14 %
Date Time Interrogation Session: 20220608040015
Implantable Lead Implant Date: 20200622
Implantable Lead Implant Date: 20200622
Implantable Lead Location: 753859
Implantable Lead Location: 753860
Implantable Pulse Generator Implant Date: 20200622
Lead Channel Impedance Value: 360 Ohm
Lead Channel Impedance Value: 560 Ohm
Lead Channel Pacing Threshold Amplitude: 0.625 V
Lead Channel Pacing Threshold Amplitude: 0.75 V
Lead Channel Pacing Threshold Pulse Width: 0.4 ms
Lead Channel Pacing Threshold Pulse Width: 0.5 ms
Lead Channel Sensing Intrinsic Amplitude: 2.4 mV
Lead Channel Sensing Intrinsic Amplitude: 5.9 mV
Lead Channel Setting Pacing Amplitude: 0.875
Lead Channel Setting Pacing Amplitude: 1.75 V
Lead Channel Setting Pacing Pulse Width: 0.5 ms
Lead Channel Setting Sensing Sensitivity: 2 mV
Pulse Gen Model: 2272
Pulse Gen Serial Number: 3311922

## 2021-04-28 LAB — CBC
Hematocrit: 34.2 % — ABNORMAL LOW (ref 37.5–51.0)
Hemoglobin: 11.6 g/dL — ABNORMAL LOW (ref 13.0–17.7)
MCH: 32.4 pg (ref 26.6–33.0)
MCHC: 33.9 g/dL (ref 31.5–35.7)
MCV: 96 fL (ref 79–97)
Platelets: 188 10*3/uL (ref 150–450)
RBC: 3.58 x10E6/uL — ABNORMAL LOW (ref 4.14–5.80)
RDW: 12.7 % (ref 11.6–15.4)
WBC: 5.1 10*3/uL (ref 3.4–10.8)

## 2021-04-28 LAB — LIPID PANEL
Chol/HDL Ratio: 4.8 ratio (ref 0.0–5.0)
Cholesterol, Total: 145 mg/dL (ref 100–199)
HDL: 30 mg/dL — ABNORMAL LOW (ref >39)
LDL Chol Calc (NIH): 95 mg/dL (ref 0–99)
Triglycerides: 108 mg/dL (ref 0–149)
VLDL Cholesterol Cal: 20 mg/dL (ref 5–40)

## 2021-04-28 LAB — TSH: TSH: 3.4 u[IU]/mL (ref 0.450–4.500)

## 2021-04-28 NOTE — Patient Instructions (Addendum)
Medication Instructions:   Your physician recommends that you continue on your current medications as directed. Please refer to the Current Medication list given to you today.  *If you need a refill on your cardiac medications before your next appointment, please call your pharmacy*   Lab Work: TODAY LIPID/TSH/CBC  If you have labs (blood work) drawn today and your tests are completely normal, you will receive your results only by: Marland Kitchen MyChart Message (if you have MyChart) OR . A paper copy in the mail If you have any lab test that is abnormal or we need to change your treatment, we will call you to review the results.   Testing/Procedures: NONE   Follow-Up: At [facility], you and your health needs are our priority.  As part of our continuing mission to provide you with exceptional heart care, we have created designated Provider Care Teams.  These Care Teams include your primary Cardiologist (physician) and Advanced Practice Providers (APPs -  Physician Assistants and Nurse Practitioners) who all work together to provide you with the care you need, when you need it.  We recommend signing up for the patient portal called "MyChart".  Sign up information is provided on this After Visit Summary.  MyChart is used to connect with patients for Virtual Visits (Telemedicine).  Patients are able to view lab/test results, encounter notes, upcoming appointments, etc.  Non-urgent messages can be sent to your provider as well.   To learn more about what you can do with MyChart, go to [email or web address].    Your next appointment:   9 month(s)  The format for your next appointment:   In Person  Provider:   Richardson Dopp, PA-C   Other Instructions Your physician wants you to follow-up in: 9 month with Richardson Dopp, PA.  You will receive a reminder letter in the mail two months in advance. If you don't receive a letter, please call our office to schedule the follow-up appointment.   Keep  Follow up with Dr. Rayann Heman in August.    You have been referred to Dr. Radford Pax to see if can do something about your CPAP.

## 2021-04-28 NOTE — Telephone Encounter (Signed)
  Patient Consent for Virtual Visit         Jared Chaney has provided verbal consent on 04/28/2021 for a virtual visit (video or telephone).   CONSENT FOR VIRTUAL VISIT FOR:  Jared Chaney  By participating in this virtual visit I agree to the following:  I hereby voluntarily request, consent and authorize [facility] and its employed or contracted physicians, physician assistants, nurse practitioners or other licensed health care professionals (the Practitioner), to provide me with telemedicine health care services (the "Services") as deemed necessary by the treating Practitioner. I acknowledge and consent to receive the Services by the Practitioner via telemedicine. I understand that the telemedicine visit will involve communicating with the Practitioner through live audiovisual communication technology and the disclosure of certain medical information by electronic transmission. I acknowledge that I have been given the opportunity to request an in-person assessment or other available alternative prior to the telemedicine visit and am voluntarily participating in the telemedicine visit.  I understand that I have the right to withhold or withdraw my consent to the use of telemedicine in the course of my care at any time, without affecting my right to future care or treatment, and that the Practitioner or I may terminate the telemedicine visit at any time. I understand that I have the right to inspect all information obtained and/or recorded in the course of the telemedicine visit and may receive copies of available information for a reasonable fee.  I understand that some of the potential risks of receiving the Services via telemedicine include:  Marland Kitchen Delay or interruption in medical evaluation due to technological equipment failure or disruption; . Information transmitted may not be sufficient (e.g. poor resolution of images) to allow for appropriate medical decision making by the  Practitioner; and/or  . In rare instances, security protocols could fail, causing a breach of personal health information.  Furthermore, I acknowledge that it is my responsibility to provide information about my medical history, conditions and care that is complete and accurate to the best of my ability. I acknowledge that Practitioner's advice, recommendations, and/or decision may be based on factors not within their control, such as incomplete or inaccurate data provided by me or distortions of diagnostic images or specimens that may result from electronic transmissions. I understand that the practice of medicine is not an exact science and that Practitioner makes no warranties or guarantees regarding treatment outcomes. I acknowledge that a copy of this consent can be made available to me via my patient portal ([place]), or I can request a printed copy by calling the office of [place].    I understand that my insurance will be billed for this visit.   I have read or had this consent read to me. . I understand the contents of this consent, which adequately explains the benefits and risks of the Services being provided via telemedicine.  . I have been provided ample opportunity to ask questions regarding this consent and the Services and have had my questions answered to my satisfaction. . I give my informed consent for the services to be provided through the use of telemedicine in my medical care

## 2021-04-30 ENCOUNTER — Other Ambulatory Visit: Payer: Self-pay | Admitting: *Deleted

## 2021-04-30 DIAGNOSIS — I2510 Atherosclerotic heart disease of native coronary artery without angina pectoris: Secondary | ICD-10-CM

## 2021-04-30 DIAGNOSIS — I5032 Chronic diastolic (congestive) heart failure: Secondary | ICD-10-CM

## 2021-04-30 MED ORDER — ROSUVASTATIN CALCIUM 10 MG PO TABS: 10.0000 mg | ORAL_TABLET | Freq: Every day | ORAL | 3 refills | Status: DC

## 2021-05-07 ENCOUNTER — Telehealth (INDEPENDENT_AMBULATORY_CARE_PROVIDER_SITE_OTHER): Payer: Medicare Other | Admitting: Cardiology

## 2021-05-07 ENCOUNTER — Other Ambulatory Visit: Payer: Self-pay

## 2021-05-07 VITALS — BP 138/67 | HR 69 | Ht 76.0 in | Wt 290.0 lb

## 2021-05-07 DIAGNOSIS — Z6835 Body mass index (BMI) 35.0-35.9, adult: Secondary | ICD-10-CM

## 2021-05-07 DIAGNOSIS — G4733 Obstructive sleep apnea (adult) (pediatric): Secondary | ICD-10-CM

## 2021-05-07 DIAGNOSIS — I10 Essential (primary) hypertension: Secondary | ICD-10-CM

## 2021-05-07 NOTE — Progress Notes (Signed)
Virtual Visit via Video Note   This visit type was conducted due to national recommendations for restrictions regarding the COVID-19 Pandemic (e.g. social distancing) in an effort to limit this patient's exposure and mitigate transmission in our community.  Due to his co-morbid illnesses, this patient is at least at moderate risk for complications without adequate follow up.  This format is felt to be most appropriate for this patient at this time.  All issues noted in this document were discussed and addressed.  A limited physical exam was performed with this format.  Please refer to the patient's chart for his consent to telehealth for [facility].   Date:  05/07/2021   ID:  Jared Chaney, DOB 18-Nov-1946, MRN [id number] The patient was identified using 2 identifiers.  Patient Location: Home Provider Location: Home Office   PCP:  [name], FNP   [facility] Providers Cardiologist:  None Cardiology APP:  Liliane Shi, PA-C  Electrophysiologist:  Thompson Grayer, MD     Evaluation Performed:  Follow-Up Visit  Chief Complaint:  OSA  History of Present Illness:    Jared Chaney is a 75 y.o. male with a hx of severe AS s/p TAVR, HTN, AVB s/p PPM and OSA on CPAP but intolerant who was recently seen by Richardson Dopp, PA and due to complaints of not tolerating his CPAP he was referred to me.  He tells me that he had gastric bypass surgery in 2008 and lost 140lbs and stopped using his PAP device.    In 2015 his wife passed and he was sleeping by himself and did not notice that he was snoring.  He then had a partner and was told that he snores really bad.  He talked with his PCP and had a sleep study done and was placed back on CPAP therapy.  This was when he was in [place] and moved to [place] this past year.    He says that he never really liked the PAP mask but would wear it.  Once he moved here he stopped using the device because he was no longer sleeping with anyone that he could  annoy with his snoring.  He says that the last time he used his device was over a year ago.  He says that he has prostate Ca and has had XRT and hormone tx and he gets up very frequently during the night to urinate so he does not sleep well at baseline and feels tired in the am.    The patient does not have symptoms concerning for COVID-19 infection (fever, chills, cough, or new shortness of breath).    Past Medical History:  Diagnosis Date   Allergies    Aortic stenosis    s/p TAVR   AV block    s/p PPM   Back pain    Body mass index (BMI) 34.0-34.9, adult    Borderline diabetes    CKD (chronic kidney disease)    Diastolic dysfunction    Erectile dysfunction    Hypertension    Hypothyroidism    Iron deficiency anemia    Kidney cysts    LVH (left ventricular hypertrophy) due to hypertensive disease    Onychomycosis of toenail    OSA (obstructive sleep apnea)    not compliant with CPAP   Prostate cancer ([place])    Retinal vein occlusion    Vitamin D deficiency    Past Surgical History:  Procedure Laterality Date   ANKLE SURGERY  FOOT SURGERY Right    GASTRIC BY PASS     KNEE SURGERY     PACEMAKER IMPLANT Left 05/13/2019   SJM Assurity MRI model 2272 PPM implanted in [place] by Dr Emeline General for "AV block"   SKIN TRANSPLANTATION     TAVR       Current Meds  Medication Sig   amLODipine (NORVASC) 10 MG tablet Take 10 mg by mouth daily.   amoxicillin (AMOXIL) 500 MG tablet Take 500 mg by mouth as directed. Only piror to dental procedures   aspirin EC 81 MG tablet Take 81 mg by mouth daily. Swallow whole.   azithromycin (ZITHROMAX) 250 MG tablet Take 250 mg by mouth as needed (for protection against infections/bacteria).    benazepril-hydrochlorthiazide (LOTENSIN HCT) 20-25 MG tablet Take 1 tablet by mouth daily.   bicalutamide (CASODEX) 50 MG tablet Take 50 mg by mouth daily.   fexofenadine (ALLEGRA) 180 MG tablet Take 180 mg by mouth as needed for allergies or rhinitis.    fluticasone (FLONASE) 50 MCG/ACT nasal spray Place 1 spray into the nose in the morning and at bedtime.   FLUTICASONE PROPIONATE EX Apply topically.   levothyroxine (SYNTHROID) 75 MCG tablet Take 75 mcg by mouth daily before breakfast.   omeprazole (PRILOSEC) 20 MG capsule Take 20 mg by mouth daily.   rosuvastatin (CRESTOR) 10 MG tablet Take 1 tablet (10 mg total) by mouth daily.   Selenium 200 MCG CAPS Take 1 capsule by mouth daily.   sildenafil (VIAGRA) 50 MG tablet Take 50 mg by mouth daily as needed for erectile dysfunction.   Specialty Vitamins Products [facility]) CAPS Take by mouth.   Specialty Vitamins Products (PROSTATE PO) Take by mouth.   thiamine 100 MG tablet Take 100 mg by mouth daily.     Allergies:   Codeine   Social History   Tobacco Use   Smoking status: Former    Packs/day: 0.50    Years: 24.00    Pack years: 12.00    Types: Cigarettes    Start date: 3    Quit date: 1984    Years since quitting: 38.4   Smokeless tobacco: Never  Substance Use Topics   Alcohol use: Yes    Comment: 6pk per week     Family Hx: The patient's family history includes CVA in an other family member; Heart attack in his mother; Hypertension in his father and mother; Renal Disease in his father.  ROS:   Please see the history of present illness.     All other systems reviewed and are negative.   Prior CV studies:   The following studies were reviewed today:  None  Labs/Other Tests and Data Reviewed:    EKG:  No ECG reviewed.  Recent Labs: 04/28/2021: Hemoglobin 11.6; Platelets 188; TSH 3.400   Recent Lipid Panel Lab Results  Component Value Date/Time   CHOL 145 04/28/2021 10:28 AM   TRIG 108 04/28/2021 10:28 AM   HDL 30 (L) 04/28/2021 10:28 AM   CHOLHDL 4.8 04/28/2021 10:28 AM   LDLCALC 95 04/28/2021 10:28 AM    Wt Readings from Last 3 Encounters:  05/07/21 290 lb (131.5 kg)  04/28/21 292 lb (132.5 kg)  01/13/21 290 lb (131.5 kg)     Risk  Assessment/Calculations:          Objective:    Vital Signs:  BP 138/67   Pulse 69   Ht 6\' 4"  (1.93 m)   Wt 290 lb (131.5 kg)  BMI 35.30 kg/m    VITAL SIGNS:  reviewed GEN:  no acute distress EYES:  sclerae anicteric, EOMI - Extraocular Movements Intact RESPIRATORY:  normal respiratory effort, symmetric expansion CARDIOVASCULAR:  no peripheral edema SKIN:  no rash, lesions or ulcers. MUSCULOSKELETAL:  no obvious deformities. NEURO:  alert and oriented x 3, no obvious focal deficit PSYCH:  normal affect  ASSESSMENT & PLAN:    OSA -he was on CPAP therapy prior to his gastric bypass surgery in 2008 and then stopped using it in 2015 as he was sleeping by himself and was not bothering anyone with his snoring -he had a sleep study done 2 years ago and went back on CPAP due to snoring but recently moved to [facility] and stopped using it a year ago -he feels tired all the time because he gets up frequently at night due to his prostate CA -He tells me that he did not tolerate the mask in the past.  It was a full face mask and was never tried on a Nasal mask or nasal pillow mask.  -unfortunately he is not a candidate for the Inspire device due to his obesity with BMI > 32 -I will get him set up for a split night sleep study and try him with a nasal mask with chin strap  2.  HTN -BP is adequately controlled on exam today -Continue prescription drug management with Amlodipine 10mg  daily, Benazepril-HCT 20-25mg  daily  3.  Morbid Obesity -I have encouraged him to get into a routine exercise program and cut back on carbs and portions.    COVID-19 Education: The signs and symptoms of COVID-19 were discussed with the patient and how to seek care for testing (follow up with PCP or arrange E-visit).  The importance of social distancing was discussed today.  Time:   Today, I have spent 20 minutes with the patient with telehealth technology discussing the above problems.     Medication  Adjustments/Labs and Tests Ordered: Current medicines are reviewed at length with the patient today.  Concerns regarding medicines are outlined above.   Tests Ordered: No orders of the defined types were placed in this encounter.   Medication Changes: No orders of the defined types were placed in this encounter.   Follow Up:  Virtual Visit  PRN after sleep study  Signed, Fransico Him, MD  05/07/2021 10:14 AM    [place]

## 2021-05-21 NOTE — Progress Notes (Signed)
Remote pacemaker transmission.   

## 2021-05-27 ENCOUNTER — Telehealth: Payer: Self-pay | Admitting: *Deleted

## 2021-05-27 DIAGNOSIS — G4733 Obstructive sleep apnea (adult) (pediatric): Secondary | ICD-10-CM

## 2021-05-27 NOTE — Telephone Encounter (Signed)
-----   Message from Lauralee Evener, [place] sent at 05/12/2021  8:52 AM EDT ----- Regarding: FW: Sleep study This is a [employer] patient. ----- Message ----- From: Mendel Ryder, CMA Sent: 05/11/2021  12:04 PM EDT To: Lauralee Evener, CMA Subject: FW: Sleep study                                Sorry, patient is attached now.  ----- Message ----- From: Lauralee Evener, CMA Sent: 05/10/2021   2:15 PM EDT To: Mendel Ryder, CMA Subject: RE: Sleep study                                There is no patient attached to this request. ----- Message ----- From: Mendel Ryder, CMA Sent: 05/10/2021  11:17 AM EDT To: Freada Bergeron, CMA, [facility] Sleep Studies Subject: Sleep study                                    Pt needs to have a sleep study.  Dx: OSA  Thank you

## 2021-05-27 NOTE — Telephone Encounter (Signed)
Prior Authorization for SPLIT NIGHT sent to [facility] via web portal.  Prior Authorization is not required for the requested services  Decision ID #:[id number] .

## 2021-05-31 NOTE — Telephone Encounter (Signed)
Patient is scheduled for lab study on 08/10/21. Patient understands his sleep study will be done at [facility] sleep lab. Patient understands he will receive a sleep packet in a week or so. Patient understands to call if he does not receive the sleep packet in a timely manner. Patient agrees with treatment and thanked me for call.

## 2021-07-28 ENCOUNTER — Ambulatory Visit (INDEPENDENT_AMBULATORY_CARE_PROVIDER_SITE_OTHER): Payer: Medicare Other

## 2021-07-28 DIAGNOSIS — I441 Atrioventricular block, second degree: Secondary | ICD-10-CM

## 2021-07-28 LAB — CUP PACEART REMOTE DEVICE CHECK
Battery Remaining Longevity: 105 mo
Battery Remaining Percentage: 83 %
Battery Voltage: 3.01 V
Brady Statistic AP VP Percent: 9 %
Brady Statistic AP VS Percent: 19 %
Brady Statistic AS VP Percent: 5.5 %
Brady Statistic AS VS Percent: 65 %
Brady Statistic RA Percent Paced: 24 %
Brady Statistic RV Percent Paced: 14 %
Date Time Interrogation Session: 20220907042058
Implantable Lead Implant Date: 20200622
Implantable Lead Implant Date: 20200622
Implantable Lead Location: 753859
Implantable Lead Location: 753860
Implantable Pulse Generator Implant Date: 20200622
Lead Channel Impedance Value: 350 Ohm
Lead Channel Impedance Value: 530 Ohm
Lead Channel Pacing Threshold Amplitude: 0.625 V
Lead Channel Pacing Threshold Amplitude: 0.875 V
Lead Channel Pacing Threshold Pulse Width: 0.4 ms
Lead Channel Pacing Threshold Pulse Width: 0.5 ms
Lead Channel Sensing Intrinsic Amplitude: 1.9 mV
Lead Channel Sensing Intrinsic Amplitude: 5.2 mV
Lead Channel Setting Pacing Amplitude: 1.125
Lead Channel Setting Pacing Amplitude: 1.625
Lead Channel Setting Pacing Pulse Width: 0.5 ms
Lead Channel Setting Sensing Sensitivity: 2 mV
Pulse Gen Model: 2272
Pulse Gen Serial Number: 3311922

## 2021-08-02 ENCOUNTER — Ambulatory Visit: Payer: Medicare Other | Admitting: Internal Medicine

## 2021-08-02 ENCOUNTER — Encounter: Payer: Self-pay | Admitting: Internal Medicine

## 2021-08-02 ENCOUNTER — Other Ambulatory Visit: Payer: Self-pay

## 2021-08-02 VITALS — BP 132/78 | HR 70 | Ht 76.0 in | Wt 283.0 lb

## 2021-08-02 DIAGNOSIS — Z952 Presence of prosthetic heart valve: Secondary | ICD-10-CM | POA: Diagnosis not present

## 2021-08-02 DIAGNOSIS — R0989 Other specified symptoms and signs involving the circulatory and respiratory systems: Secondary | ICD-10-CM

## 2021-08-02 DIAGNOSIS — I441 Atrioventricular block, second degree: Secondary | ICD-10-CM | POA: Diagnosis not present

## 2021-08-02 DIAGNOSIS — I872 Venous insufficiency (chronic) (peripheral): Secondary | ICD-10-CM

## 2021-08-02 DIAGNOSIS — I10 Essential (primary) hypertension: Secondary | ICD-10-CM | POA: Diagnosis not present

## 2021-08-02 DIAGNOSIS — G4733 Obstructive sleep apnea (adult) (pediatric): Secondary | ICD-10-CM

## 2021-08-02 DIAGNOSIS — I5032 Chronic diastolic (congestive) heart failure: Secondary | ICD-10-CM

## 2021-08-02 NOTE — Patient Instructions (Addendum)
Medication Instructions:  Your physician recommends that you continue on your current medications as directed. Please refer to the Current Medication list given to you today.  Labwork: None ordered.  Testing/Procedures: None ordered.  Follow-Up: Your physician wants you to follow-up in: 12 months with  one of the following Advanced Practice Providers on your designated Care Team:    Legrand Como "Jonni Sanger" Chalmers Cater, [place]   You will receive a reminder letter in the mail two months in advance. If you don't receive a letter, please call our office to schedule the follow-up appointment.  Remote monitoring is used to monitor your Pacemaker from home. This monitoring reduces the number of office visits required to check your device to one time per year. It allows [place] to keep an eye on the functioning of your device to ensure it is working properly. You are scheduled for a device check from home on 10/27/21. You may send your transmission at any time that day. If you have a wireless device, the transmission will be sent automatically. After your physician reviews your transmission, you will receive a postcard with your next transmission date.  Any Other Special Instructions Will Be Listed Below (If Applicable).  If you need a refill on your cardiac medications before your next appointment, please call your pharmacy.

## 2021-08-02 NOTE — Progress Notes (Signed)
PCP: [name], FNP Primary Cardiologist: Previously Dr Carmela Rima in [facility] Primary EP:  Dr Rayann Heman  Jared Chaney is a 75 y.o. male who presents today for routine electrophysiology followup.  Since last being seen in our clinic, the patient reports doing very well.  Today, he denies symptoms of palpitations, chest pain, shortness of breath,  lower extremity edema, dizziness, presyncope, or syncope.  The patient is otherwise without complaint today.   Past Medical History:  Diagnosis Date   Allergies    Aortic stenosis    s/p TAVR   AV block    s/p PPM   Back pain    Body mass index (BMI) 34.0-34.9, adult    Borderline diabetes    CKD (chronic kidney disease)    Diastolic dysfunction    Erectile dysfunction    Hypertension    Hypothyroidism    Iron deficiency anemia    Kidney cysts    LVH (left ventricular hypertrophy) due to hypertensive disease    Onychomycosis of toenail    OSA (obstructive sleep apnea)    not compliant with CPAP   Prostate cancer ([place])    Retinal vein occlusion    Vitamin D deficiency    Past Surgical History:  Procedure Laterality Date   ANKLE SURGERY     FOOT SURGERY Right    GASTRIC BY PASS     KNEE SURGERY     PACEMAKER IMPLANT Left 05/13/2019   SJM Assurity MRI model 2272 PPM implanted in [place] by Dr Emeline General for "AV block"   SKIN TRANSPLANTATION     TAVR      ROS- all systems are reviewed and negative except as per HPI above  Current Outpatient Medications  Medication Sig Dispense Refill   amLODipine (NORVASC) 10 MG tablet Take 10 mg by mouth daily.     amoxicillin (AMOXIL) 500 MG tablet Take 500 mg by mouth as directed. Only piror to dental procedures     aspirin EC 81 MG tablet Take 81 mg by mouth daily. Swallow whole.     azithromycin (ZITHROMAX) 250 MG tablet Take 250 mg by mouth as needed (for protection against infections/bacteria).      benazepril-hydrochlorthiazide (LOTENSIN HCT) 20-25 MG tablet Take 1 tablet by mouth daily.      bicalutamide (CASODEX) 50 MG tablet Take 50 mg by mouth daily.     fexofenadine (ALLEGRA) 180 MG tablet Take 180 mg by mouth as needed for allergies or rhinitis.     fluticasone (FLONASE) 50 MCG/ACT nasal spray Place 1 spray into the nose in the morning and at bedtime.     FLUTICASONE PROPIONATE EX Apply topically.     levothyroxine (SYNTHROID) 75 MCG tablet Take 75 mcg by mouth daily before breakfast.     omeprazole (PRILOSEC) 20 MG capsule Take 20 mg by mouth daily.     Selenium 200 MCG CAPS Take 1 capsule by mouth daily.     sildenafil (VIAGRA) 50 MG tablet Take 50 mg by mouth daily as needed for erectile dysfunction.     Specialty Vitamins Products [facility]) CAPS Take by mouth.     Specialty Vitamins Products (PROSTATE PO) Take by mouth.     thiamine 100 MG tablet Take 100 mg by mouth daily.     rosuvastatin (CRESTOR) 10 MG tablet Take 1 tablet (10 mg total) by mouth daily. 90 tablet 3   No current facility-administered medications for this visit.    Physical Exam: Vitals:  08/02/21 1436  BP: 132/78  Pulse: 70  SpO2: 98%  Weight: 283 lb (128.4 kg)  Height: 6\' 4"  (1.93 m)    GEN- The patient is well appearing, alert and oriented x 3 today.   Head- normocephalic, atraumatic Eyes-  Sclera clear, conjunctiva pink Ears- hearing intact Oropharynx- clear Lungs- Clear to ausculation bilaterally, normal work of breathing Chest- pacemaker pocket is well healed Heart- Regular rate and rhythm, no murmurs, rubs or gallops, PMI not laterally displaced GI- soft, NT, ND, + BS Extremities- no clubbing, cyanosis, or edema  Pacemaker interrogation- reviewed in detail today,  See PACEART report  ekg tracing ordered today is personally reviewed and shows sinus with first degree AV block, RBBB, LAHB  Assessment and Plan:  1. Symptomatic second degree heart block Normal pacemaker function See Pace Art report He was having PMT.  Dynamic PVARP turned off today.  PVARP extended to 350  msec. he is not device dependant today  2. HTN Stable No change required today  3. Chronic diastolic dysfunction Stable No change required today  4. S/p TAVR Stable No change required today  5. OSA He is compliant with CPAP  6. Obesity Body mass index is 34.45 kg/m. Lifestyle modification is advised  Follow-up with general cardiology as scheduled Return to see EP APP annually  Thompson Grayer MD, [facility] 08/02/2021 2:46 PM

## 2021-08-05 NOTE — Progress Notes (Signed)
Remote pacemaker transmission.   

## 2021-08-09 ENCOUNTER — Other Ambulatory Visit: Payer: Self-pay

## 2021-08-09 ENCOUNTER — Other Ambulatory Visit: Payer: Medicare Other | Admitting: *Deleted

## 2021-08-09 DIAGNOSIS — I5032 Chronic diastolic (congestive) heart failure: Secondary | ICD-10-CM

## 2021-08-09 DIAGNOSIS — I2510 Atherosclerotic heart disease of native coronary artery without angina pectoris: Secondary | ICD-10-CM

## 2021-08-09 LAB — HEPATIC FUNCTION PANEL
ALT: 20 IU/L (ref 0–44)
AST: 27 IU/L (ref 0–40)
Albumin: 4 g/dL (ref 3.7–4.7)
Alkaline Phosphatase: 80 IU/L (ref 44–121)
Bilirubin Total: 0.3 mg/dL (ref 0.0–1.2)
Bilirubin, Direct: 0.14 mg/dL (ref 0.00–0.40)
Total Protein: 6.5 g/dL (ref 6.0–8.5)

## 2021-08-09 LAB — LIPID PANEL
Chol/HDL Ratio: 2.6 ratio (ref 0.0–5.0)
Cholesterol, Total: 73 mg/dL — ABNORMAL LOW (ref 100–199)
HDL: 28 mg/dL — ABNORMAL LOW (ref >39)
LDL Chol Calc (NIH): 29 mg/dL (ref 0–99)
Triglycerides: 70 mg/dL (ref 0–149)
VLDL Cholesterol Cal: 16 mg/dL (ref 5–40)

## 2021-08-10 ENCOUNTER — Encounter (HOSPITAL_BASED_OUTPATIENT_CLINIC_OR_DEPARTMENT_OTHER): Payer: Medicare Other | Admitting: Cardiology

## 2021-08-16 ENCOUNTER — Other Ambulatory Visit: Payer: Self-pay | Admitting: Nephrology

## 2021-08-16 DIAGNOSIS — N184 Chronic kidney disease, stage 4 (severe): Secondary | ICD-10-CM

## 2021-08-17 ENCOUNTER — Ambulatory Visit
Admission: RE | Admit: 2021-08-17 | Discharge: 2021-08-17 | Disposition: A | Discharge status: Home / Self Care | Payer: Medicare Other | Source: Ambulatory Visit | Attending: Nephrology | Admitting: Nephrology

## 2021-08-17 DIAGNOSIS — N184 Chronic kidney disease, stage 4 (severe): Secondary | ICD-10-CM

## 2021-08-23 ENCOUNTER — Other Ambulatory Visit (HOSPITAL_COMMUNITY): Payer: Self-pay | Admitting: *Deleted

## 2021-08-24 ENCOUNTER — Encounter (HOSPITAL_COMMUNITY)
Admission: RE | Admit: 2021-08-24 | Discharge: 2021-08-24 | Disposition: A | Discharge status: Home / Self Care | Payer: Medicare Other | Source: Ambulatory Visit | Attending: Nephrology | Admitting: Nephrology

## 2021-08-24 ENCOUNTER — Other Ambulatory Visit: Payer: Self-pay

## 2021-08-24 ENCOUNTER — Encounter (HOSPITAL_COMMUNITY): Payer: Medicare Other

## 2021-08-24 DIAGNOSIS — D631 Anemia in chronic kidney disease: Secondary | ICD-10-CM | POA: Insufficient documentation

## 2021-08-24 DIAGNOSIS — N189 Chronic kidney disease, unspecified: Secondary | ICD-10-CM | POA: Diagnosis present

## 2021-08-24 MED ORDER — SODIUM CHLORIDE 0.9 % IV SOLN
510.0000 mg | INTRAVENOUS | Status: DC
  Administered 2021-08-24: 510 mg via INTRAVENOUS
  Filled 2021-08-24: qty 510

## 2021-08-31 ENCOUNTER — Encounter (HOSPITAL_COMMUNITY): Payer: Medicare Other

## 2021-09-01 ENCOUNTER — Encounter (HOSPITAL_COMMUNITY)
Admission: RE | Admit: 2021-09-01 | Discharge: 2021-09-01 | Disposition: A | Discharge status: Home / Self Care | Payer: Medicare Other | Source: Ambulatory Visit | Attending: Nephrology | Admitting: Nephrology

## 2021-09-01 ENCOUNTER — Encounter (HOSPITAL_COMMUNITY): Payer: Medicare Other

## 2021-09-01 DIAGNOSIS — N189 Chronic kidney disease, unspecified: Secondary | ICD-10-CM | POA: Diagnosis not present

## 2021-09-01 MED ORDER — SODIUM CHLORIDE 0.9 % IV SOLN
510.0000 mg | INTRAVENOUS | Status: AC
  Administered 2021-09-01: 510 mg via INTRAVENOUS
  Filled 2021-09-01: qty 510

## 2021-10-14 ENCOUNTER — Emergency Department (HOSPITAL_COMMUNITY)
Admission: EM | Admit: 2021-10-14 | Discharge: 2021-10-15 | Disposition: A | Discharge status: Home / Self Care | Payer: Medicare Other | Attending: Emergency Medicine | Admitting: Emergency Medicine

## 2021-10-14 ENCOUNTER — Other Ambulatory Visit: Payer: Self-pay

## 2021-10-14 ENCOUNTER — Encounter (HOSPITAL_COMMUNITY): Payer: Self-pay

## 2021-10-14 DIAGNOSIS — I129 Hypertensive chronic kidney disease with stage 1 through stage 4 chronic kidney disease, or unspecified chronic kidney disease: Secondary | ICD-10-CM | POA: Insufficient documentation

## 2021-10-14 DIAGNOSIS — Z79899 Other long term (current) drug therapy: Secondary | ICD-10-CM | POA: Insufficient documentation

## 2021-10-14 DIAGNOSIS — Z87891 Personal history of nicotine dependence: Secondary | ICD-10-CM | POA: Diagnosis not present

## 2021-10-14 DIAGNOSIS — N189 Chronic kidney disease, unspecified: Secondary | ICD-10-CM | POA: Insufficient documentation

## 2021-10-14 DIAGNOSIS — E162 Hypoglycemia, unspecified: Secondary | ICD-10-CM | POA: Diagnosis not present

## 2021-10-14 DIAGNOSIS — E039 Hypothyroidism, unspecified: Secondary | ICD-10-CM | POA: Insufficient documentation

## 2021-10-14 DIAGNOSIS — R5383 Other fatigue: Secondary | ICD-10-CM | POA: Diagnosis present

## 2021-10-14 DIAGNOSIS — Z950 Presence of cardiac pacemaker: Secondary | ICD-10-CM | POA: Diagnosis not present

## 2021-10-14 DIAGNOSIS — Z7982 Long term (current) use of aspirin: Secondary | ICD-10-CM | POA: Insufficient documentation

## 2021-10-14 DIAGNOSIS — Z8546 Personal history of malignant neoplasm of prostate: Secondary | ICD-10-CM | POA: Diagnosis not present

## 2021-10-14 LAB — BASIC METABOLIC PANEL
Anion gap: 10 (ref 5–15)
BUN: 46 mg/dL — ABNORMAL HIGH (ref 8–23)
CO2: 21 mmol/L — ABNORMAL LOW (ref 22–32)
Calcium: 8.8 mg/dL — ABNORMAL LOW (ref 8.9–10.3)
Chloride: 108 mmol/L (ref 98–111)
Creatinine, Ser: 2.93 mg/dL — ABNORMAL HIGH (ref 0.61–1.24)
GFR, Estimated: 22 mL/min — ABNORMAL LOW (ref >60)
Glucose, Bld: 64 mg/dL — ABNORMAL LOW (ref 70–99)
Potassium: 3.6 mmol/L (ref 3.5–5.1)
Sodium: 139 mmol/L (ref 135–145)

## 2021-10-14 LAB — CBC WITH DIFFERENTIAL/PLATELET
Abs Immature Granulocytes: 0.02 10*3/uL (ref 0.00–0.07)
Basophils Absolute: 0.1 10*3/uL (ref 0.0–0.1)
Basophils Relative: 1 %
Eosinophils Absolute: 0.2 10*3/uL (ref 0.0–0.5)
Eosinophils Relative: 3 %
HCT: 30.1 % — ABNORMAL LOW (ref 39.0–52.0)
Hemoglobin: 9.7 g/dL — ABNORMAL LOW (ref 13.0–17.0)
Immature Granulocytes: 0 %
Lymphocytes Relative: 36 %
Lymphs Abs: 2.3 10*3/uL (ref 0.7–4.0)
MCH: 33.4 pg (ref 26.0–34.0)
MCHC: 32.2 g/dL (ref 30.0–36.0)
MCV: 103.8 fL — ABNORMAL HIGH (ref 80.0–100.0)
Monocytes Absolute: 0.6 10*3/uL (ref 0.1–1.0)
Monocytes Relative: 9 %
Neutro Abs: 3.3 10*3/uL (ref 1.7–7.7)
Neutrophils Relative %: 51 %
Platelets: 170 10*3/uL (ref 150–400)
RBC: 2.9 MIL/uL — ABNORMAL LOW (ref 4.22–5.81)
RDW: 13.2 % (ref 11.5–15.5)
WBC: 6.5 10*3/uL (ref 4.0–10.5)
nRBC: 0 % (ref 0.0–0.2)

## 2021-10-14 LAB — CBG MONITORING, ED
Glucose-Capillary: 54 mg/dL — ABNORMAL LOW (ref 70–99)
Glucose-Capillary: 105 mg/dL — ABNORMAL HIGH (ref 70–99)

## 2021-10-14 LAB — TROPONIN I (HIGH SENSITIVITY): Troponin I (High Sensitivity): 19 ng/L — ABNORMAL HIGH (ref <18)

## 2021-10-14 MED ORDER — SODIUM CHLORIDE 0.9 % IV BOLUS
1000.0000 mL | Freq: Once | INTRAVENOUS | Status: AC
  Administered 2021-10-14: 1000 mL via INTRAVENOUS

## 2021-10-14 NOTE — ED Triage Notes (Signed)
BIB [facility] from home. Pt is lethargic but A&Ox4. Known hx of right bundle branch block, pace maker, valve replacement. With EMS pt wen to "sleep" and became diaphoretic.   112/64 70 heart rate 16 RR 94% RA CBG 102

## 2021-10-14 NOTE — ED Provider Notes (Signed)
  Physical Exam  BP 123/66   Pulse 66   Temp 97.6 F (36.4 C) (Oral)   Resp 13   Ht 6\' 3"  (1.905 m)   Wt 122.5 kg   SpO2 100%   BMI 33.75 kg/m   Physical Exam Vitals and nursing note reviewed.  Constitutional:      General: He is not in acute distress.    Appearance: Normal appearance. He is not ill-appearing.  HENT:     Head: Normocephalic and atraumatic.  Pulmonary:     Effort: Pulmonary effort is normal.  Skin:    General: Skin is warm and dry.  Neurological:     General: No focal deficit present.     Mental Status: He is alert and oriented to person, place, and time.    ED Course/Procedures     Procedures  MDM  Care assumed from Dr. Almyra Free at shift change.  Patient awaiting results of a second troponin and repeat blood glucose.  Patient apparently became unresponsive while speaking on the phone this evening.  EMS found his sugar to be 54 and this was felt to be the cause of his episode.  Patient is drinking orange juice and awaiting results of second troponin.  Patient to be reassessed at that time and disposition pending with discharge anticipated.  Troponin has returned and is stable.  Blood sugar has been repeated and repeated twice at 105 and 123.  Patient feels back to baseline.  Patient and daughter informed of the results of his studies and patient seems comfortable with disposition.  He will be discharged to home with primary doctor follow-up.  Patient ambulated to the bathroom with no difficulty and steady gait.    Veryl Speak, MD 10/15/21 [phone]

## 2021-10-14 NOTE — ED Notes (Signed)
Pt provided with orange juice.

## 2021-10-14 NOTE — ED Provider Notes (Signed)
[facility] EMERGENCY DEPARTMENT Provider Note   CSN: [id number] Arrival date & time: 10/14/21  2146     History Chief Complaint  Patient presents with   Lethargic    Jared Chaney is a 75 y.o. male.  Patient presents with chief complaint of increased sleepiness.  Daughter states that he appeared to be confused sleepy difficult to arouse.  EMS report his blood sugar was low.  Blood sugar noted to be 53 on arrival here.  Patient is more alert, states that he has no recent headache or chest pain.  Denies any fevers or cough or vomiting or diarrhea.  He states he was talking with his friend on the phone when he just became very weak and tired feeling.      Past Medical History:  Diagnosis Date   Allergies    Aortic stenosis    s/p TAVR   AV block    s/p PPM   Back pain    Body mass index (BMI) 34.0-34.9, adult    Borderline diabetes    CKD (chronic kidney disease)    Diastolic dysfunction    Erectile dysfunction    Hypertension    Hypothyroidism    Iron deficiency anemia    Kidney cysts    LVH (left ventricular hypertrophy) due to hypertensive disease    Onychomycosis of toenail    OSA (obstructive sleep apnea)    not compliant with CPAP   Prostate cancer (HCC)    Retinal vein occlusion    Vitamin D deficiency     There are no problems to display for this patient.   Past Surgical History:  Procedure Laterality Date   ANKLE SURGERY     FOOT SURGERY Right    GASTRIC BY PASS     KNEE SURGERY     PACEMAKER IMPLANT Left 05/13/2019   SJM Assurity MRI model 2272 PPM implanted in [place] by Dr Emeline General for "AV block"   SKIN TRANSPLANTATION     TAVR         Family History  Problem Relation Age of Onset   Hypertension Mother    Heart attack Mother    Hypertension Father    Renal Disease Father    CVA Other     Social History   Tobacco Use   Smoking status: Former    Packs/day: 0.50    Years: 24.00    Pack years: 12.00    Types:  Cigarettes    Start date: 39    Quit date: 1984    Years since quitting: 38.9   Smokeless tobacco: Never  Substance Use Topics   Alcohol use: Yes    Comment: 6pk per week    Home Medications Prior to Admission medications   Medication Sig Start Date End Date Taking? Authorizing Provider  amLODipine (NORVASC) 10 MG tablet Take 10 mg by mouth daily. 06/01/20   [provider]  amoxicillin (AMOXIL) 500 MG tablet Take 500 mg by mouth as directed. Only piror to dental procedures    [provider]  aspirin EC 81 MG tablet Take 81 mg by mouth daily. Swallow whole.    [provider]  azithromycin (ZITHROMAX) 250 MG tablet Take 250 mg by mouth as needed (for protection against infections/bacteria).     [provider]  benazepril-hydrochlorthiazide (LOTENSIN HCT) 20-25 MG tablet Take 1 tablet by mouth daily.    [provider]  bicalutamide (CASODEX) 50 MG tablet Take 50 mg by mouth daily.  [provider]  fexofenadine (ALLEGRA) 180 MG tablet Take 180 mg by mouth as needed for allergies or rhinitis.    [provider]  fluticasone (FLONASE) 50 MCG/ACT nasal spray Place 1 spray into the nose in the morning and at bedtime. 06/01/20   [provider]  FLUTICASONE PROPIONATE EX Apply topically.    [provider]  levothyroxine (SYNTHROID) 75 MCG tablet Take 75 mcg by mouth daily before breakfast.    [provider]  omeprazole (PRILOSEC) 20 MG capsule Take 20 mg by mouth daily.    [provider]  rosuvastatin (CRESTOR) 10 MG tablet Take 1 tablet (10 mg total) by mouth daily. 04/30/21 07/29/21  Richardson Dopp T, PA-C  Selenium 200 MCG CAPS Take 1 capsule by mouth daily.    [provider]  sildenafil (VIAGRA) 50 MG tablet Take 50 mg by mouth daily as needed for erectile dysfunction.    [provider]  Specialty Vitamins Products [facility]) CAPS Take by mouth.    [provider]  Specialty Vitamins Products (PROSTATE PO) Take by mouth.    [provider]  thiamine 100 MG tablet Take 100 mg by mouth daily.    [provider]    Allergies    Codeine  Review of Systems   Review of Systems  Constitutional:  Negative for fever.  HENT:  Negative for ear pain and sore throat.   Eyes:  Negative for pain.  Respiratory:  Negative for cough.   Cardiovascular:  Negative for chest pain.  Gastrointestinal:  Negative for abdominal pain.  Genitourinary:  Negative for flank pain.  Musculoskeletal:  Negative for back pain.  Skin:  Negative for color change and rash.  Neurological:  Negative for syncope.  All other systems reviewed and are negative.  Physical Exam Updated Vital Signs BP 123/66   Pulse 66   Temp 97.6 F (36.4 C) (Oral)   Resp 13   Ht 6\' 3"  (1.905 m)   Wt 122.5 kg   SpO2 100%   BMI 33.75 kg/m   Physical Exam Constitutional:      Appearance: He is well-developed.  HENT:     Head: Normocephalic.     Nose: Nose normal.  Eyes:     Extraocular Movements: Extraocular movements intact.  Cardiovascular:     Rate and Rhythm: Normal rate.  Pulmonary:     Effort: Pulmonary effort is normal.  Skin:    Coloration: Skin is not jaundiced.  Neurological:     General: No focal deficit present.     Mental Status: He is alert and oriented to person, place, and time. Mental status is at baseline.     Cranial Nerves: No cranial nerve deficit.     Motor: No weakness.    ED Results / Procedures / Treatments   Labs (all labs ordered are listed, but only abnormal results are displayed) Labs Reviewed  CBC WITH DIFFERENTIAL/PLATELET - Abnormal; Notable for the following components:      Result Value   RBC 2.90 (*)    Hemoglobin 9.7 (*)    HCT 30.1 (*)    MCV 103.8 (*)    All other components within normal limits  BASIC METABOLIC PANEL - Abnormal; Notable for the following components:   CO2 21 (*)    Glucose, Bld 64 (*)     BUN 46 (*)    Creatinine, Ser 2.93 (*)    Calcium 8.8 (*)    GFR, Estimated 22 (*)  All other components within normal limits  CBG MONITORING, ED - Abnormal; Notable for the following components:   Glucose-Capillary 54 (*)    All other components within normal limits  CBG MONITORING, ED - Abnormal; Notable for the following components:   Glucose-Capillary 105 (*)    All other components within normal limits  TROPONIN I (HIGH SENSITIVITY) - Abnormal; Notable for the following components:   Troponin I (High Sensitivity) 19 (*)    All other components within normal limits    EKG EKG Interpretation  Date/Time:  Thursday October 14 2021 21:56:28 EST Ventricular Rate:  65 PR Interval:  207 QRS Duration: 170 QT Interval:  489 QTC Calculation: 509 R Axis:   -48 Text Interpretation: Sinus rhythm Multiple premature complexes, vent & supraven RBBB and LAFB Left ventricular hypertrophy Confirmed by Thamas Jaegers ([id number]) on 10/14/2021 10:27:22 PM  Radiology No results found.  Procedures Procedures   Medications Ordered in ED Medications  sodium chloride 0.9 % bolus 1,000 mL (1,000 mLs Intravenous New Bag/Given 10/14/21 2238)    ED Course  I have reviewed the triage vital signs and the nursing notes.  Pertinent labs & imaging results that were available during my care of the patient were reviewed by me and considered in my medical decision making (see chart for details).    MDM Rules/Calculators/A&P                           Patient with cardiac disease, pacemaker, known history of kidney disease last creatinine ranging from 2.8-3.0 per patient.  Patient denies any history of diabetes, found to be moderately hypoglycemic and 53 blood sugar.  Given orange juice with improvement of numbers.  Mental status appears improved and continues to be so.  Troponin elevated at 19 significance is unclear given elevated creatinine levels.  Patient continues to have no complaints of  chest pain or headache or discomfort.  Repeat troponin and repeat glucose levels are pending.  Patient signed out to oncoming provider.  Final Clinical Impression(s) / ED Diagnoses Final diagnoses:  Hypoglycemia    Rx / DC Orders ED Discharge Orders     None        Luna Fuse, MD 10/14/21 [phone]

## 2021-10-15 LAB — CBG MONITORING, ED: Glucose-Capillary: 123 mg/dL — ABNORMAL HIGH (ref 70–99)

## 2021-10-15 LAB — TROPONIN I (HIGH SENSITIVITY): Troponin I (High Sensitivity): 17 ng/L (ref <18)

## 2021-10-15 NOTE — Discharge Instructions (Signed)
Continue medications as previously prescribed.  Follow-up with your primary doctor in the next 4 to 5 days for a recheck, and return to the ER if your symptoms significantly worsen or change.

## 2021-10-27 ENCOUNTER — Ambulatory Visit (INDEPENDENT_AMBULATORY_CARE_PROVIDER_SITE_OTHER): Payer: Medicare Other

## 2021-10-27 DIAGNOSIS — I441 Atrioventricular block, second degree: Secondary | ICD-10-CM | POA: Diagnosis not present

## 2021-10-27 LAB — CUP PACEART REMOTE DEVICE CHECK
Battery Remaining Longevity: 102 mo
Battery Remaining Percentage: 81 %
Battery Voltage: 3.01 V
Brady Statistic AP VP Percent: 3 %
Brady Statistic AP VS Percent: 26 %
Brady Statistic AS VP Percent: 1.8 %
Brady Statistic AS VS Percent: 67 %
Brady Statistic RA Percent Paced: 25 %
Brady Statistic RV Percent Paced: 4.8 %
Date Time Interrogation Session: 20221207040025
Implantable Lead Implant Date: 20200622
Implantable Lead Implant Date: 20200622
Implantable Lead Location: 753859
Implantable Lead Location: 753860
Implantable Pulse Generator Implant Date: 20200622
Lead Channel Impedance Value: 360 Ohm
Lead Channel Impedance Value: 460 Ohm
Lead Channel Pacing Threshold Amplitude: 0.75 V
Lead Channel Pacing Threshold Amplitude: 0.75 V
Lead Channel Pacing Threshold Pulse Width: 0.4 ms
Lead Channel Pacing Threshold Pulse Width: 0.5 ms
Lead Channel Sensing Intrinsic Amplitude: 2.1 mV
Lead Channel Sensing Intrinsic Amplitude: 4.7 mV
Lead Channel Setting Pacing Amplitude: 1 V
Lead Channel Setting Pacing Amplitude: 1.75 V
Lead Channel Setting Pacing Pulse Width: 0.5 ms
Lead Channel Setting Sensing Sensitivity: 2 mV
Pulse Gen Model: 2272
Pulse Gen Serial Number: 3311922

## 2021-11-02 ENCOUNTER — Telehealth: Payer: Self-pay

## 2021-11-02 NOTE — Telephone Encounter (Signed)
Letter has been sent to patient instructing them to call us if they are still interested in completing their sleep study. If we have not received a response from the patient within 30 days of this notice, the order will be cancelled and they will need to discuss the need for a sleep study at their next office visit.  ° °

## 2021-11-05 NOTE — Progress Notes (Signed)
Remote pacemaker transmission.   

## 2022-01-05 ENCOUNTER — Ambulatory Visit: Payer: Medicare Other | Admitting: Podiatry

## 2022-01-19 ENCOUNTER — Ambulatory Visit: Payer: Medicare Other | Admitting: Podiatry

## 2022-01-19 ENCOUNTER — Other Ambulatory Visit: Payer: Self-pay

## 2022-01-19 DIAGNOSIS — E1140 Type 2 diabetes mellitus with diabetic neuropathy, unspecified: Secondary | ICD-10-CM | POA: Diagnosis not present

## 2022-01-19 DIAGNOSIS — L603 Nail dystrophy: Secondary | ICD-10-CM | POA: Diagnosis not present

## 2022-01-19 NOTE — Progress Notes (Signed)
?  Subjective:  ?Patient ID: Jared Chaney, male    DOB: 1946-03-15,   MRN: [id number] ? ?Chief Complaint  ?Patient presents with  ? Diabetes  ?  Patient request toenail trim   ? ? ?76 y.o. male presents for concern discolored toenails  and diabetic foot check. . Denies any burning or tingling in her feet. Patient is diabetic and last A1c was 6.4.  ? . Denies any other pedal complaints. Denies n/v/f/c.  ? ?PCP: Novella Rob FNP ? ?Past Medical History:  ?Diagnosis Date  ? Allergies   ? Aortic stenosis   ? s/p TAVR  ? AV block   ? s/p PPM  ? Back pain   ? Body mass index (BMI) 34.0-34.9, adult   ? Borderline diabetes   ? CKD (chronic kidney disease)   ? Diastolic dysfunction   ? Erectile dysfunction   ? Hypertension   ? Hypothyroidism   ? Iron deficiency anemia   ? Kidney cysts   ? LVH (left ventricular hypertrophy) due to hypertensive disease   ? Onychomycosis of toenail   ? OSA (obstructive sleep apnea)   ? not compliant with CPAP  ? Prostate cancer ([place])   ? Retinal vein occlusion   ? Vitamin D deficiency   ? ? ?Objective:  ?Physical Exam: ?Vascular: DP/PT pulses 2/4 bilateral. CFT <3 seconds. Normal hair growth on digits. No edema.  ?Skin. No lacerations or abrasions bilateral feet. Right hallux and left second digit with underlying hemosiderin in nails. No pain to palpation.  ?Musculoskeletal: MMT 5/5 bilateral lower extremities in DF, PF, Inversion and Eversion. Deceased ROM in DF of ankle joint.  ?Neurological: Sensation intact to light touch. Protective sensation diminished.  ? ?Assessment:  ? ?1. Type 2 diabetes mellitus with diabetic neuropathy, unspecified whether long term insulin use ([place])   ?2. Onychodystrophy   ? ? ? ?Plan:  ?Patient was evaluated and treated and all questions answered. ?-Discussed and educated patient on diabetic foot care, especially with  ?regards to the vascular, neurological and musculoskeletal systems.  ?-Stressed the importance of good glycemic control and the detriment of not   ?controlling glucose levels in relation to the foot. ?-Discussed supportive shoes at all times and checking feet regularly.  ?-Mechanically debrided all nails 1-5 bilateral using sterile nail nipper and filed with dremel without incident as courtesy  ?-Answered all patient questions ?-Patient to return  in 1 year for diabetic foot check.  ?-Patient advised to call the office if any problems or questions arise in the meantime. ? ? ?Lorenda Peck, DPM  ? ? ?

## 2022-01-26 ENCOUNTER — Ambulatory Visit (INDEPENDENT_AMBULATORY_CARE_PROVIDER_SITE_OTHER): Payer: Medicare Other

## 2022-01-26 DIAGNOSIS — I441 Atrioventricular block, second degree: Secondary | ICD-10-CM

## 2022-01-26 LAB — CUP PACEART REMOTE DEVICE CHECK
Battery Remaining Longevity: 98 mo
Battery Remaining Percentage: 78 %
Battery Voltage: 3.01 V
Brady Statistic AP VP Percent: 3 %
Brady Statistic AP VS Percent: 25 %
Brady Statistic AS VP Percent: 1.9 %
Brady Statistic AS VS Percent: 68 %
Brady Statistic RA Percent Paced: 24 %
Brady Statistic RV Percent Paced: 4.9 %
Date Time Interrogation Session: 20230308040023
Implantable Lead Implant Date: 20200622
Implantable Lead Implant Date: 20200622
Implantable Lead Location: 753859
Implantable Lead Location: 753860
Implantable Pulse Generator Implant Date: 20200622
Lead Channel Impedance Value: 360 Ohm
Lead Channel Impedance Value: 460 Ohm
Lead Channel Pacing Threshold Amplitude: 0.875 V
Lead Channel Pacing Threshold Amplitude: 0.875 V
Lead Channel Pacing Threshold Pulse Width: 0.4 ms
Lead Channel Pacing Threshold Pulse Width: 0.5 ms
Lead Channel Sensing Intrinsic Amplitude: 2 mV
Lead Channel Sensing Intrinsic Amplitude: 3.1 mV
Lead Channel Setting Pacing Amplitude: 1.125
Lead Channel Setting Pacing Amplitude: 1.875
Lead Channel Setting Pacing Pulse Width: 0.5 ms
Lead Channel Setting Sensing Sensitivity: 2 mV
Pulse Gen Model: 2272
Pulse Gen Serial Number: 3311922

## 2022-02-08 ENCOUNTER — Telehealth: Payer: Self-pay | Admitting: Internal Medicine

## 2022-02-08 NOTE — Telephone Encounter (Signed)
? ?  STAT if patient feels like he/she is going to faint  ? ?Are you dizzy now? No  ? ?Do you feel faint or have you passed out? No  ? ?Do you have any other symptoms? Low blood sugar ? ?Have you checked your HR and BP (record if available)? BP 149/71 149/65, 140/65, HR 65-70 ? ?Pt said he's been feeling dizzy late, he wanted to know if there unusual activity with his device  ?

## 2022-02-08 NOTE — Telephone Encounter (Signed)
Patient complaining of dizziness on Sunday and yesterday. Patient stated his blood sugar was low those two days. Patient also reported SBP that have been in the 140's and HR 60 to 70's. Patient BP today 149/64. Patient denies any dizziness or other symptoms today. Since patient's device was reported with no issues,will forward to Dr. Rayann Heman for further advisement.  ?

## 2022-02-09 NOTE — Progress Notes (Signed)
Remote pacemaker transmission.   

## 2022-02-14 NOTE — Telephone Encounter (Signed)
Have previously sent to scheduling, but no appointment made. ? ?Will have EP scheduler reach out to Pt to schedule f/u due with general APP. ?

## 2022-02-14 NOTE — Progress Notes (Signed)
? ? ?Office Visit  ?  ?Patient Name: Jared Chaney ?Date of Encounter: 02/15/2022 ? ?Primary Care Provider:  [name], FNP ?Primary Cardiologist:  Werner Lean, MD ?Primary Electrophysiologist: Thompson Grayer, MD ?Chief Complaint  ?  ?20-monthfollow-up for history of CAD ? ? Patient Profile: ?Aortic stenosis> s/p TAVR ?AV block>s/p PPM (SJM Assurity MRI pacemaker)  04/2019, followed by Dr. ARayann Heman?CKD ?HTN ?OSA ?Prostate cancer ?IDA ?HFpEF: ?RBBB ?Macular degeneration ?Gastric bypass 2008 ?Carotid artery disease ([place]) ? ? Recent Studies: ?Left heart cath 04/2019> completed and in [place] 40% mid LAD, 30% LCx proximal, RCA no CAD ?Echo completed 01/2020> EF 50-55% mild AI, normal TAVR gradient, mild TR, mild MR, mild LVH with no RWMA ?Carotid duplex bilateral 07/2020> bilateral 1-39%, antegrade flow from vertebral arteries. ? ? ?History of Present Illness  ?  ?Jared BEEDLEis a 76y.o. male with PMH of HFp severe AS s/p TAVR, HTN, RBBB, AVB s/p PPM and OSA on CPAP Presents today for 9 month follow up. He was last seen on 04/2021 by SRichardson Dopp PA. During visit patient described symptoms of exhaustion that occurs after exertional work.  He denied chest pain, SOB, syncope.  Patient was also having difficulty with tolerating CPAP therapy due to history of prostate CA and frequent urination at night.  He was referred to Dr. TRadford Paxfor assistance with tolerating CPAP at night. ? ? ?Since last being seen in our clinic the patient reports doing well with the exception of a few episode of hypoglycemia. He is checking his glucose more frequently and also carrying glucose tablets. His home B/P's are running in the 140's over 60's. He is compliant with his medications and is following a low sodium diet. He is working out at [employer] his friend and also doing line dancing. He is happy to report that his PSA levels are now within normal limits. He denies chest pain, palpitations, dyspnea, PND, orthopnea,  nausea, vomiting, dizziness, syncope, edema, weight gain,or early satiety. ? ?Past Medical History  ?  ?Past Medical History:  ?Diagnosis Date  ? Allergies   ? Aortic stenosis   ? s/p TAVR  ? AV block   ? s/p PPM  ? Back pain   ? Body mass index (BMI) 34.0-34.9, adult   ? Borderline diabetes   ? CKD (chronic kidney disease)   ? Diastolic dysfunction   ? Erectile dysfunction   ? Hypertension   ? Hypothyroidism   ? Iron deficiency anemia   ? Kidney cysts   ? LVH (left ventricular hypertrophy) due to hypertensive disease   ? Onychomycosis of toenail   ? OSA (obstructive sleep apnea)   ? not compliant with CPAP  ? [place]   ? Retinal vein occlusion   ? Vitamin D deficiency   ? ?Past Surgical History:  ?Procedure Laterality Date  ? ANKLE SURGERY    ? FOOT SURGERY Right   ? GASTRIC BY PASS    ? KNEE SURGERY    ? PACEMAKER IMPLANT Left 05/13/2019  ? SJM Assurity MRI model 2272 PPM [place] "AV block"  ? SKIN TRANSPLANTATION    ? TAVR    ? ? ?Allergies ? ?Allergies  ?Allergen Reactions  ? Codeine   ?  Other reaction(s): Unknown  ? ? ?Home Medications  ?  ?Current Outpatient Medications  ?Medication Sig Dispense Refill  ? amLODipine (NORVASC) 10 MG tablet Take 10 mg by mouth  daily.    ? amoxicillin (AMOXIL) 500 MG tablet Take 500 mg by mouth as directed. Only piror to dental procedures    ? aspirin EC 81 MG tablet Take 81 mg by mouth daily. Swallow whole.    ? azithromycin (ZITHROMAX) 250 MG tablet Take 250 mg by mouth as needed (for protection against infections/bacteria).     ? benazepril-hydrochlorthiazide (LOTENSIN HCT) 20-25 MG tablet Take 1 tablet by mouth daily.    ? fexofenadine (ALLEGRA) 180 MG tablet Take 180 mg by mouth as needed for allergies or rhinitis.    ? fluticasone (FLONASE) 50 MCG/ACT nasal spray Place 1 spray into the nose in the morning and at bedtime.    ? FLUTICASONE PROPIONATE EX Apply topically.    ? ipratropium (ATROVENT) 0.03 % nasal spray Place 2 sprays into  both nostrils 2 (two) times daily.    ? levothyroxine (SYNTHROID) 75 MCG tablet Take 75 mcg by mouth daily before breakfast.    ? NUBEQA 300 MG tablet Take 600 mg by mouth 2 (two) times daily.    ? omeprazole (PRILOSEC) 20 MG capsule Take 20 mg by mouth daily.    ? rosuvastatin (CRESTOR) 10 MG tablet Take 1 tablet (10 mg total) by mouth daily. 90 tablet 3  ? Selenium 200 MCG CAPS Take 1 capsule by mouth daily.    ? Specialty Vitamins Products [facility]) CAPS Take 1 tablet by mouth daily.    ? Specialty Vitamins Products (PROSTATE PO) Take 1 tablet by mouth daily.    ? ?No current facility-administered medications for this visit.  ?  ? ?Review of Systems  ?Please see the history of present illness.    ?(+) Left chronic knee paint  ?All other systems reviewed and are otherwise negative except as noted above. ? ?Physical Exam  ?  ?Wt Readings from Last 3 Encounters:  ?02/15/22 277 lb 9.6 oz (125.9 kg)  ?10/14/21 270 lb (122.5 kg)  ?09/01/21 280 lb (127 kg)  ? ?VS: ?Vitals:  ? 02/15/22 1439  ?BP: (!) 130/58  ?Pulse: (!) 55  ?SpO2: 98%  ?,Body mass index is 34.7 kg/m?. ? ?Constitutional:   ?   Appearance: Healthy appearance. Not in distress.  ?Neck:  ?   Vascular: JVD normal.  ?Pulmonary:  ?   Effort: Pulmonary effort is normal.  ?   Breath sounds: No wheezing. No rales.  ?Cardiovascular:  ?   Normal rate. Regular rhythm. Normal S1. Normal S2.   ?   Murmurs: There is no murmur.  ?Edema: ?   Peripheral edema absent.  ?Abdominal:  ?   Palpations: Abdomen is soft. There is no hepatomegaly.  ?Skin: ?   General: Skin is warm and dry.  ?Neurological:  ?   General: No focal deficit present.  ?   Mental Status: Alert and oriented to person, place and time.  ?   Cranial Nerves: Cranial nerves are intact.  ?EKG/LABS/Other Studies Reviewed  ?  ?ECG personally reviewed by me today - none ordered ?  ?Risk Assessment/Calculations:   ?  ? ?Lab Results  ?Component Value Date  ? WBC 6.5 10/14/2021  ? HGB 9.7 (L) 10/14/2021  ? HCT 30.1  (L) 10/14/2021  ? MCV 103.8 (H) 10/14/2021  ? PLT 170 10/14/2021  ? ?Lab Results  ?Component Value Date  ? CREATININE 2.93 (H) 10/14/2021  ? BUN 46 (H) 10/14/2021  ? NA 139 10/14/2021  ? K 3.6 10/14/2021  ? CL 108 10/14/2021  ? CO2 21 (L)  10/14/2021  ? ?Lab Results  ?Component Value Date  ? ALT 20 08/09/2021  ? AST 27 08/09/2021  ? ALKPHOS 80 08/09/2021  ? BILITOT 0.3 08/09/2021  ? ?Lab Results  ?Component Value Date  ? CHOL 73 (L) 08/09/2021  ? HDL 28 (L) 08/09/2021  ? [place] 29 08/09/2021  ? TRIG 70 08/09/2021  ? CHOLHDL 2.6 08/09/2021  ?  ?No results found for: HGBA1C ? ?Assessment & Plan  ?  ?1.  HFpEF: ?-Current GDMT:  He remains on diuretic in the form of Benazepril -HCT 20-25 mg daily ?-NYHA II, he is euvolemic on examination and denies CP and SOB.  He is experienced 2 episodes of dizziness and dehydration.  If this persists we will reduce his Benazapril- HCTZ to 20-12.5. ? ?-Low sodium diet, fluid restriction <2L, and daily weights encouraged. Educated to contact our office for weight gain of 2 lbs overnight or 5 lbs in one week.  ? ?2.  Non Rheumatic Aortic valve stenosis: ?-S/p TAVR, completed in [place] with normal valve gradient per echo 2021. ?-Patient advised of the importance of SBE prophylaxis for any dental procedures. ? ?3.  Permanent pacemaker: ?-Followed by  Dr. Rayann Heman and implanted in [place] June 2020 ? ? ?4.  Obstructive sleep apnea: ?-He was referred to Dr. Radford Pax for assistance with tolerating CPAP.  He reports that currently he is using a oral appliance that has reduced his snoring.  He is also experiencing less sleepiness throughout the day. ? ?5.  Essential hypertension: ?-Blood pressure today was 130/58.  Advised to continue to document his blood pressures daily and continue low-sodium heart healthy diet. ?-Continue amlodipine 10 mg daily ?-Benazepril-HCT 20-25 mg daily ? ?6.  Obesity: ?-BMI is 34.45 ?-He is currently [employer] is dancing with his new friend. ? ?  Disposition:  Follow-up with Werner Lean, MD or APP in 12 months ?   ?Medication Adjustments/Labs and Tests Ordered: ?Current medicines are reviewed at length with the patient today.  Concerns regarding medicines are

## 2022-02-15 ENCOUNTER — Ambulatory Visit: Payer: Medicare Other | Admitting: Nurse Practitioner

## 2022-02-15 ENCOUNTER — Encounter: Payer: Self-pay | Admitting: Nurse Practitioner

## 2022-02-15 ENCOUNTER — Other Ambulatory Visit: Payer: Self-pay

## 2022-02-15 VITALS — BP 130/58 | HR 55 | Ht 75.0 in | Wt 277.6 lb

## 2022-02-15 DIAGNOSIS — G4733 Obstructive sleep apnea (adult) (pediatric): Secondary | ICD-10-CM | POA: Diagnosis not present

## 2022-02-15 DIAGNOSIS — I350 Nonrheumatic aortic (valve) stenosis: Secondary | ICD-10-CM

## 2022-02-15 DIAGNOSIS — Z950 Presence of cardiac pacemaker: Secondary | ICD-10-CM

## 2022-02-15 DIAGNOSIS — I5032 Chronic diastolic (congestive) heart failure: Secondary | ICD-10-CM

## 2022-02-15 DIAGNOSIS — I10 Essential (primary) hypertension: Secondary | ICD-10-CM

## 2022-02-15 NOTE — Patient Instructions (Addendum)
Medication Instructions:  ?Your physician recommends that you continue on your current medications as directed. Please refer to the Current Medication list given to you today. ? ?*If you need a refill on your cardiac medications before your next appointment, please call your pharmacy* ? ? ?Lab Work: ?None ordered  ? ?If you have labs (blood work) drawn today and your tests are completely normal, you will receive your results only by: ?MyChart Message (if you have MyChart) OR ?A paper copy in the mail ?If you have any lab test that is abnormal or we need to change your treatment, we will call you to review the results. ? ? ?Testing/Procedures: ?None ordered ? ? ?Follow-Up: ?At [facility], you and your health needs are our priority.  As part of our continuing mission to provide you with exceptional heart care, we have created designated Provider Care Teams.  These Care Teams include your primary Cardiologist (physician) and Advanced Practice Providers (APPs -  Physician Assistants and Nurse Practitioners) who all work together to provide you with the care you need, when you need it. ? ?We recommend signing up for the patient portal called "MyChart".  Sign up information is provided on this After Visit Summary.  MyChart is used to connect with patients for Virtual Visits (Telemedicine).  Patients are able to view lab/test results, encounter notes, upcoming appointments, etc.  Non-urgent messages can be sent to your provider as well.   ?To learn more about what you can do with MyChart, go to [email or web address].   ? ?Your next appointment:   ?12 month(s) ? ?The format for your next appointment:   ?In Person ? ?Provider:   ? Richardson Dopp, PA-C ? ? ?Other Instructions ?Low-Sodium Eating Plan ?Sodium, which is an element that makes up salt, helps you maintain a healthy balance of fluids in your body. Too much sodium can increase your blood pressure and cause fluid and waste to be held in your body. ?Your health  care provider or dietitian may recommend following this plan if you have high blood pressure (hypertension), kidney disease, liver disease, or heart failure. Eating less sodium can help lower your blood pressure, reduce swelling, and protect your heart, liver, and kidneys. ?What are tips for following this plan? ?Reading food labels ?The Nutrition Facts label lists the amount of sodium in one serving of the food. If you eat more than one serving, you must multiply the listed amount of sodium by the number of servings. ?Choose foods with less than 140 mg of sodium per serving. ?Avoid foods with 300 mg of sodium or more per serving. ?Shopping ? ?Look for lower-sodium products, often labeled as "low-sodium" or "no salt added." ?Always check the sodium content, even if foods are labeled as "unsalted" or "no salt added." ?Buy fresh foods. ?Avoid canned foods and pre-made or frozen meals. ?Avoid canned, cured, or processed meats. ?Buy breads that have less than 80 mg of sodium per slice. ?Cooking ? ?Eat more home-cooked food and less restaurant, buffet, and fast food. ?Avoid adding salt when cooking. Use salt-free seasonings or herbs instead of table salt or sea salt. Check with your health care provider or pharmacist before using salt substitutes. ?Cook with plant-based oils, such as canola, sunflower, or olive oil. ?Meal planning ?When eating at a restaurant, ask that your food be prepared with less salt or no salt, if possible. Avoid dishes labeled as brined, pickled, cured, smoked, or made with soy sauce, miso, or teriyaki sauce. ?Avoid foods that contain  MSG (monosodium glutamate). MSG is sometimes added to [place] food, bouillon, and some canned foods. ?Make meals that can be grilled, baked, poached, roasted, or steamed. These are generally made with less sodium. ?General information ?Most people on this plan should limit their sodium intake to 1,500-2,000 mg (milligrams) of sodium each day. ?What foods should I  eat? ?Fruits ?Fresh, frozen, or canned fruit. Fruit juice. ?Vegetables ?Fresh or frozen vegetables. "No salt added" canned vegetables. "No salt added" tomato sauce and paste. Low-sodium or reduced-sodium tomato and vegetable juice. ?Grains ?Low-sodium cereals, including oats, puffed wheat and rice, and shredded wheat. Low-sodium crackers. Unsalted rice. Unsalted pasta. Low-sodium bread. Whole-grain breads and whole-grain pasta. ?Meats and other proteins ?Fresh or frozen (no salt added) meat, poultry, seafood, and fish. Low-sodium canned tuna and salmon. Unsalted nuts. Dried peas, beans, and lentils without added salt. Unsalted canned beans. Eggs. Unsalted nut butters. ?Dairy ?Milk. Soy milk. Cheese that is naturally low in sodium, such as ricotta cheese, fresh mozzarella, or Swiss cheese. Low-sodium or reduced-sodium cheese. Cream cheese. Yogurt. ?Seasonings and condiments ?Fresh and dried herbs and spices. Salt-free seasonings. Low-sodium mustard and ketchup. Sodium-free salad dressing. Sodium-free light mayonnaise. Fresh or refrigerated horseradish. Lemon juice. Vinegar. ?Other foods ?Homemade, reduced-sodium, or low-sodium soups. Unsalted popcorn and pretzels. Low-salt or salt-free chips. ?The items listed above may not be a complete list of foods and beverages you can eat. Contact a dietitian for more information. ?What foods should I avoid? ?Vegetables ?Sauerkraut, pickled vegetables, and relishes. Olives. [place] fries. Onion rings. Regular canned vegetables (not low-sodium or reduced-sodium). Regular canned tomato sauce and paste (not low-sodium or reduced-sodium). Regular tomato and vegetable juice (not low-sodium or reduced-sodium). Frozen vegetables in sauces. ?Grains ?Instant hot cereals. Bread stuffing, pancake, and biscuit mixes. Croutons. Seasoned rice or pasta mixes. Noodle soup cups. Boxed or frozen macaroni and cheese. Regular salted crackers. Self-rising flour. ?Meats and other proteins ?Meat or  fish that is salted, canned, smoked, spiced, or pickled. Precooked or cured meat, such as sausages or meat loaves. Berniece Salines. Ham. Pepperoni. Hot dogs. Corned beef. Chipped beef. Salt pork. Jerky. Pickled herring. Anchovies and sardines. Regular canned tuna. Salted nuts. ?Dairy ?Processed cheese and cheese spreads. Hard cheeses. Cheese curds. Blue cheese. Feta cheese. String cheese. Regular cottage cheese. Buttermilk. Canned milk. ?Fats and oils ?Salted butter. Regular margarine. Ghee. Bacon fat. ?Seasonings and condiments ?Onion salt, garlic salt, seasoned salt, table salt, and sea salt. Canned and packaged gravies. Worcestershire sauce. Tartar sauce. Barbecue sauce. Teriyaki sauce. Soy sauce, including reduced-sodium. Steak sauce. Fish sauce. Oyster sauce. Cocktail sauce. Horseradish that you find on the shelf. Regular ketchup and mustard. Meat flavorings and tenderizers. Bouillon cubes. Hot sauce. Pre-made or packaged marinades. Pre-made or packaged taco seasonings. Relishes. Regular salad dressings. Salsa. ?Other foods ?Salted popcorn and pretzels. Corn chips and puffs. Potato and tortilla chips. Canned or dried soups. Pizza. Frozen entrees and pot pies. ?The items listed above may not be a complete list of foods and beverages you should avoid. Contact a dietitian for more information. ?Summary ?Eating less sodium can help lower your blood pressure, reduce swelling, and protect your heart, liver, and kidneys. ?Most people on this plan should limit their sodium intake to 1,500-2,000 mg (milligrams) of sodium each day. ?Canned, boxed, and frozen foods are high in sodium. Restaurant foods, fast foods, and pizza are also very high in sodium. You also get sodium by adding salt to food. ?Try to cook at home, eat more fresh fruits and vegetables, and eat  less fast food and canned, processed, or prepared foods. ?This information is not intended to replace advice given to you by your health care provider. Make sure you  discuss any questions you have with your health care provider. ?Document Revised: 12/13/2019 Document Reviewed: 10/09/2019 ?Elsevier Patient Education ? [place]. ? ?Mediterranean Diet ?A Mediterranean die

## 2022-03-02 ENCOUNTER — Other Ambulatory Visit: Payer: Self-pay

## 2022-03-02 ENCOUNTER — Telehealth: Payer: Self-pay | Admitting: Internal Medicine

## 2022-03-02 ENCOUNTER — Encounter (HOSPITAL_COMMUNITY): Payer: Self-pay

## 2022-03-02 ENCOUNTER — Emergency Department (HOSPITAL_COMMUNITY)
Admission: EM | Admit: 2022-03-02 | Discharge: 2022-03-03 | Disposition: A | Discharge status: Home / Self Care | Payer: Medicare Other | Attending: Emergency Medicine | Admitting: Emergency Medicine

## 2022-03-02 ENCOUNTER — Emergency Department (HOSPITAL_COMMUNITY): Payer: Medicare Other

## 2022-03-02 DIAGNOSIS — R079 Chest pain, unspecified: Secondary | ICD-10-CM | POA: Insufficient documentation

## 2022-03-02 DIAGNOSIS — H538 Other visual disturbances: Secondary | ICD-10-CM | POA: Diagnosis present

## 2022-03-02 DIAGNOSIS — Z7982 Long term (current) use of aspirin: Secondary | ICD-10-CM | POA: Diagnosis not present

## 2022-03-02 DIAGNOSIS — Z79899 Other long term (current) drug therapy: Secondary | ICD-10-CM | POA: Diagnosis not present

## 2022-03-02 DIAGNOSIS — I10 Essential (primary) hypertension: Secondary | ICD-10-CM | POA: Insufficient documentation

## 2022-03-02 DIAGNOSIS — E039 Hypothyroidism, unspecified: Secondary | ICD-10-CM | POA: Diagnosis not present

## 2022-03-02 DIAGNOSIS — H539 Unspecified visual disturbance: Secondary | ICD-10-CM

## 2022-03-02 LAB — URINALYSIS, ROUTINE W REFLEX MICROSCOPIC
Bacteria, UA: NONE SEEN
Bilirubin Urine: NEGATIVE
Glucose, UA: >=500 mg/dL — AB
Hgb urine dipstick: NEGATIVE
Ketones, ur: NEGATIVE mg/dL
Leukocytes,Ua: NEGATIVE
Nitrite: NEGATIVE
Protein, ur: NEGATIVE mg/dL
Specific Gravity, Urine: 1.011 (ref 1.005–1.030)
pH: 6 (ref 5.0–8.0)

## 2022-03-02 LAB — COMPREHENSIVE METABOLIC PANEL
ALT: 33 U/L (ref 0–44)
AST: 41 U/L (ref 15–41)
Albumin: 3.7 g/dL (ref 3.5–5.0)
Alkaline Phosphatase: 62 U/L (ref 38–126)
Anion gap: 9 (ref 5–15)
BUN: 36 mg/dL — ABNORMAL HIGH (ref 8–23)
CO2: 21 mmol/L — ABNORMAL LOW (ref 22–32)
Calcium: 8.8 mg/dL — ABNORMAL LOW (ref 8.9–10.3)
Chloride: 110 mmol/L (ref 98–111)
Creatinine, Ser: 2.51 mg/dL — ABNORMAL HIGH (ref 0.61–1.24)
GFR, Estimated: 26 mL/min — ABNORMAL LOW (ref >60)
Glucose, Bld: 152 mg/dL — ABNORMAL HIGH (ref 70–99)
Potassium: 3.8 mmol/L (ref 3.5–5.1)
Sodium: 140 mmol/L (ref 135–145)
Total Bilirubin: 0.7 mg/dL (ref 0.3–1.2)
Total Protein: 7 g/dL (ref 6.5–8.1)

## 2022-03-02 LAB — DIFFERENTIAL
Abs Immature Granulocytes: 0.01 10*3/uL (ref 0.00–0.07)
Basophils Absolute: 0 10*3/uL (ref 0.0–0.1)
Basophils Relative: 0 %
Eosinophils Absolute: 0.2 10*3/uL (ref 0.0–0.5)
Eosinophils Relative: 3 %
Immature Granulocytes: 0 %
Lymphocytes Relative: 27 %
Lymphs Abs: 1.4 10*3/uL (ref 0.7–4.0)
Monocytes Absolute: 0.4 10*3/uL (ref 0.1–1.0)
Monocytes Relative: 8 %
Neutro Abs: 3.2 10*3/uL (ref 1.7–7.7)
Neutrophils Relative %: 62 %

## 2022-03-02 LAB — RAPID URINE DRUG SCREEN, HOSP PERFORMED
Amphetamines: NOT DETECTED
Barbiturates: NOT DETECTED
Benzodiazepines: NOT DETECTED
Cocaine: NOT DETECTED
Opiates: NOT DETECTED
Tetrahydrocannabinol: NOT DETECTED

## 2022-03-02 LAB — I-STAT CHEM 8, ED
BUN: 36 mg/dL — ABNORMAL HIGH (ref 8–23)
Calcium, Ion: 1 mmol/L — ABNORMAL LOW (ref 1.15–1.40)
Chloride: 111 mmol/L (ref 98–111)
Creatinine, Ser: 2.9 mg/dL — ABNORMAL HIGH (ref 0.61–1.24)
Glucose, Bld: 149 mg/dL — ABNORMAL HIGH (ref 70–99)
HCT: 30 % — ABNORMAL LOW (ref 39.0–52.0)
Hemoglobin: 10.2 g/dL — ABNORMAL LOW (ref 13.0–17.0)
Potassium: 3.8 mmol/L (ref 3.5–5.1)
Sodium: 141 mmol/L (ref 135–145)
TCO2: 21 mmol/L — ABNORMAL LOW (ref 22–32)

## 2022-03-02 LAB — ETHANOL: Alcohol, Ethyl (B): <10 mg/dL (ref <10)

## 2022-03-02 LAB — APTT: aPTT: 34 seconds (ref 24–36)

## 2022-03-02 LAB — PROTIME-INR
INR: 1.1 (ref 0.8–1.2)
Prothrombin Time: 14.2 seconds (ref 11.4–15.2)

## 2022-03-02 LAB — CBC
HCT: 30.2 % — ABNORMAL LOW (ref 39.0–52.0)
Hemoglobin: 9.7 g/dL — ABNORMAL LOW (ref 13.0–17.0)
MCH: 33.7 pg (ref 26.0–34.0)
MCHC: 32.1 g/dL (ref 30.0–36.0)
MCV: 104.9 fL — ABNORMAL HIGH (ref 80.0–100.0)
Platelets: 143 10*3/uL — ABNORMAL LOW (ref 150–400)
RBC: 2.88 MIL/uL — ABNORMAL LOW (ref 4.22–5.81)
RDW: 13.2 % (ref 11.5–15.5)
WBC: 5.1 10*3/uL (ref 4.0–10.5)
nRBC: 0 % (ref 0.0–0.2)

## 2022-03-02 NOTE — ED Triage Notes (Signed)
Pt arrived via POV for c/o seeing spider webs and cloudiness in right eyex2 hrs ago. Pt states also 5/10 non radiating mid sternal chest tightness and a slight HA started today.Pt has equal strength bilat. ?

## 2022-03-02 NOTE — Telephone Encounter (Signed)
Patient is currently at [facility] ED. Patient does not have remote monitor with him. Advised that the hospital is about to check his device if the doctor orders it.  Patient voiced understanding. Noted to patient that I hope everything works out good for him and he makes a speedy recovery. He was appreciative.  ?

## 2022-03-02 NOTE — Telephone Encounter (Signed)
Patient would like to know if any issues have been reported on PPM. He would like to have a device nurse review and give him a call back. He has right eye retina vein occclusion and today his vision has been blurry. His PCP advised to go to ED for an evaluation to make sure he was not having a stroke and he has been there for about 1 hour.  ? ?

## 2022-03-02 NOTE — ED Provider Triage Note (Signed)
Emergency Medicine Provider Triage Evaluation Note ? ?Roselee Nova , a 76 y.o. male  was evaluated in triage.  Pt complains of visual changes. ? ?Review of Systems  ?Positive: Cloudy vision in R eye, mild headache, mild chest discomfort ?Negative: Focal numbness, facial droop, confusion ? ?Physical Exam  ?BP (!) 151/74   Pulse 66   Temp 97.7 ?F (36.5 ?C) (Oral)   Resp 18   Ht '6\' 3"'$  (1.905 m)   SpO2 100%   BMI 34.70 kg/m?  ?Gen:   Awake, no distress   ?Resp:  Normal effort  ?MSK:   Moves extremities without difficulty  ?Other:   ? ?Medical Decision Making  ?Medically screening exam initiated at 3:25 PM.  Appropriate orders placed.  ZAVIAN SLOWEY was informed that the remainder of the evaluation will be completed by another provider, this initial triage assessment does not replace that evaluation, and the importance of remaining in the ED until their evaluation is complete. ? ?Painless R visual changes 2 hrs ago. Hx of CRAO of R eye.  No obvious focal neuro deficit.  I did discussed with DR. Plunkett ?  ?Domenic Moras, PA-C ?03/02/22 1535 ? ?

## 2022-03-02 NOTE — ED Notes (Signed)
Pt ambulatory to restroom and back.

## 2022-03-02 NOTE — ED Provider Notes (Signed)
?[facility] ?Provider Note ? ? ?CSN: [id number] ?Arrival date & time: 03/02/22  1428 ? ?  ? ?History ? ?Chief Complaint  ?Patient presents with  ? Eye Problem  ? ? ?Jared Chaney is a 76 y.o. male. ? ?Patient is a 76 year old male with past medical history of hypertension, hypothyroidism, defibrillator, prior retinal vein occlusion.  Patient presenting today with complaints of visual disturbances.  He started this morning with the onset of what he describes as "spider webs" in his line of vision coming from his right eye.  He denies any injury or trauma.  He denies any pain.  Symptoms actually improving somewhat throughout his prolonged waiting room stay.  Patient called his primary doctor and was instructed to come to the ER to rule out the possibility of stroke. ? ?The history is provided by the patient.  ?Eye Problem ?Location:  Right eye ?Severity:  Moderate ?Onset quality:  Sudden ?Duration:  12 hours ?Timing:  Constant ?Progression:  Partially resolved ?Chronicity:  New ?Relieved by:  Nothing ?Worsened by:  Nothing ? ?  ? ?Home Medications ?Prior to Admission medications   ?Medication Sig Start Date End Date Taking? Authorizing Provider  ?amLODipine (NORVASC) 10 MG tablet Take 10 mg by mouth daily. 06/01/20   [provider]  ?amoxicillin (AMOXIL) 500 MG tablet Take 500 mg by mouth as directed. Only piror to dental procedures    [provider]  ?aspirin EC 81 MG tablet Take 81 mg by mouth daily. Swallow whole.    [provider]  ?azithromycin (ZITHROMAX) 250 MG tablet Take 250 mg by mouth as needed (for protection against infections/bacteria).     [provider]  ?benazepril-hydrochlorthiazide (LOTENSIN HCT) 20-25 MG tablet Take 1 tablet by mouth daily.    [provider]  ?fexofenadine (ALLEGRA) 180 MG tablet Take 180 mg by mouth as needed for allergies or rhinitis.    [provider]  ?fluticasone (FLONASE) 50  MCG/ACT nasal spray Place 1 spray into the nose in the morning and at bedtime. 06/01/20   [provider]  ?FLUTICASONE PROPIONATE EX Apply topically.    [provider]  ?ipratropium (ATROVENT) 0.03 % nasal spray Place 2 sprays into both nostrils 2 (two) times daily. 12/23/21   [provider]  ?levothyroxine (SYNTHROID) 75 MCG tablet Take 75 mcg by mouth daily before breakfast.    [provider]  ?NUBEQA 300 MG tablet Take 600 mg by mouth 2 (two) times daily. 10/11/21   [provider]  ?omeprazole (PRILOSEC) 20 MG capsule Take 20 mg by mouth daily.    [provider]  ?rosuvastatin (CRESTOR) 10 MG tablet Take 1 tablet (10 mg total) by mouth daily. 04/30/21 02/15/22  Richardson Dopp T, PA-C  ?Selenium 200 MCG CAPS Take 1 capsule by mouth daily.    [provider]  ?Specialty Vitamins Products [facility]) CAPS Take 1 tablet by mouth daily.    [provider]  ?Specialty Vitamins Products (PROSTATE PO) Take 1 tablet by mouth daily.    [provider]  ?   ? ?Allergies    ?Codeine   ? ?Review of Systems   ?Review of Systems  ?All other systems reviewed and are negative. ? ?Physical Exam ?Updated Vital Signs ?BP (!) 151/67 (BP Location: Right Arm)   Pulse 63   Temp 97.7 ?F (36.5 ?C) (Oral)   Resp 15   Ht '6\' 3"'$  (1.905 m)   SpO2 100%  BMI 34.70 kg/m?  ?Physical Exam ?Vitals and nursing note reviewed.  ?Constitutional:   ?   General: He is not in acute distress. ?   Appearance: Normal appearance. He is not ill-appearing.  ?HENT:  ?   Head: Normocephalic and atraumatic.  ?Eyes:  ?   Comments: Bilateral eyes appear grossly normal.  Pupils are 3 mm and reactive.  The funduscopic exam is limited, but I see no obvious abnormalities.  ?Pulmonary:  ?   Effort: Pulmonary effort is normal.  ?Neurological:  ?   General: No focal deficit present.  ?   Mental Status: He is alert and oriented to person, place, and time.  ? ? ?ED Results / Procedures /  Treatments   ?Labs ?(all labs ordered are listed, but only abnormal results are displayed) ?Labs Reviewed  ?CBC - Abnormal; Notable for the following components:  ?    Result Value  ? RBC 2.88 (*)   ? Hemoglobin 9.7 (*)   ? HCT 30.2 (*)   ? MCV 104.9 (*)   ? Platelets 143 (*)   ? All other components within normal limits  ?COMPREHENSIVE METABOLIC PANEL - Abnormal; Notable for the following components:  ? CO2 21 (*)   ? Glucose, Bld 152 (*)   ? BUN 36 (*)   ? Creatinine, Ser 2.51 (*)   ? Calcium 8.8 (*)   ? GFR, Estimated 26 (*)   ? All other components within normal limits  ?URINALYSIS, ROUTINE W REFLEX MICROSCOPIC - Abnormal; Notable for the following components:  ? Color, Urine STRAW (*)   ? Glucose, UA >=500 (*)   ? All other components within normal limits  ?I-STAT CHEM 8, ED - Abnormal; Notable for the following components:  ? BUN 36 (*)   ? Creatinine, Ser 2.90 (*)   ? Glucose, Bld 149 (*)   ? Calcium, Ion 1.00 (*)   ? TCO2 21 (*)   ? Hemoglobin 10.2 (*)   ? HCT 30.0 (*)   ? All other components within normal limits  ?RESP PANEL BY RT-PCR (FLU A&B, COVID) ARPGX2  ?ETHANOL  ?PROTIME-INR  ?APTT  ?DIFFERENTIAL  ?RAPID URINE DRUG SCREEN, HOSP PERFORMED  ? ? ?EKG ?None ? ?Radiology ?CT HEAD WO CONTRAST (5MM) ? ?Result Date: 03/02/2022 ?CLINICAL DATA:  Visual changes. EXAM: CT HEAD WITHOUT CONTRAST TECHNIQUE: Contiguous axial images were obtained from the base of the skull through the vertex without intravenous contrast. RADIATION DOSE REDUCTION: This exam was performed according to the departmental dose-optimization program which includes automated exposure control, adjustment of the mA and/or kV according to patient size and/or use of iterative reconstruction technique. COMPARISON:  None. FINDINGS: Brain: No evidence of acute infarction, hemorrhage, hydrocephalus, extra-axial collection or mass lesion/mass effect. Vascular: No hyperdense vessel or unexpected calcification. Skull: Normal. Negative for fracture or  focal lesion. Sinuses/Orbits: No acute finding. Other: None. IMPRESSION: No acute intracranial abnormality seen. Electronically Signed   By: Marijo Conception M.D.   On: 03/02/2022 18:08   ? ?Procedures ?Procedures  ? ? ?Medications Ordered in ED ?Medications - No data to display ? ?ED Course/ Medical Decision Making/ A&P ? ?This patient presents to the ED for concern of visual disturbances, this involves an extensive number of treatment options, and is a complaint that carries with it a high risk of complications and morbidity.  The differential diagnosis includes acute stroke, thromboembolic event, retinal detachment, retinal hemorrhage ? ? ?Co morbidities that complicate the patient evaluation ? ?Prior retinal  vein occlusion ? ? ?Additional history obtained: ? ?No additional history or external records needed ? ? ?Lab Tests: ? ?I Ordered, and personally interpreted labs.  The pertinent results include: Unremarkable CBC with the section of anemia.  Metabolic panel showing baseline renal insufficiency, but otherwise unremarkable ? ? ?Imaging Studies ordered: ? ?I ordered imaging studies including CTA of the head ?I independently visualized and interpreted imaging which showed no acute abnormality ?I agree with the radiologist interpretation ? ? ?Cardiac Monitoring: / EKG: ? ?The patient was maintained on a cardiac monitor.  I personally viewed and interpreted the cardiac monitored which showed an underlying rhythm of: Sinus rhythm ? ? ?Consultations Obtained: ? ?I requested consultation with the ophthalmologist, Dr. Tobe Sos,  and discussed lab and imaging findings as well as pertinent plan - they recommend: Follow-up in the office tomorrow for formal eye exam ? ? ?Problem List / ED Course / Critical interventions / Medication management ? ?Patient presenting with visual disturbances that sounds like a vitreal hemorrhage or detachment.  He is otherwise neurologically intact and has no other complaints.  His work-up is  unremarkable.  This was discussed with Dr. Tobe Sos from ophthalmology.  He will see the patient in the morning in the office. ?No medications ordered were given ?I have reviewed the patients home medicines and

## 2022-03-02 NOTE — Telephone Encounter (Signed)
Noted ./cy 

## 2022-03-03 NOTE — Discharge Instructions (Signed)
Dr. Tobe Sos will see you tomorrow.  He is advised that you show up at the office at 8 AM and will see you then. ? ? ?

## 2022-04-27 ENCOUNTER — Ambulatory Visit (INDEPENDENT_AMBULATORY_CARE_PROVIDER_SITE_OTHER): Payer: Medicare Other

## 2022-04-27 DIAGNOSIS — I441 Atrioventricular block, second degree: Secondary | ICD-10-CM | POA: Diagnosis not present

## 2022-05-03 LAB — CUP PACEART REMOTE DEVICE CHECK
Battery Remaining Longevity: 95 mo
Battery Remaining Percentage: 76 %
Battery Voltage: 3.01 V
Brady Statistic AP VP Percent: 3.1 %
Brady Statistic AP VS Percent: 25 %
Brady Statistic AS VP Percent: 2.1 %
Brady Statistic AS VS Percent: 68 %
Brady Statistic RA Percent Paced: 24 %
Brady Statistic RV Percent Paced: 5.2 %
Date Time Interrogation Session: 20230611002108
Implantable Lead Implant Date: 20200622
Implantable Lead Implant Date: 20200622
Implantable Lead Location: 753859
Implantable Lead Location: 753860
Implantable Pulse Generator Implant Date: 20200622
Lead Channel Impedance Value: 360 Ohm
Lead Channel Impedance Value: 480 Ohm
Lead Channel Pacing Threshold Amplitude: 0.875 V
Lead Channel Pacing Threshold Amplitude: 1 V
Lead Channel Pacing Threshold Pulse Width: 0.4 ms
Lead Channel Pacing Threshold Pulse Width: 0.5 ms
Lead Channel Sensing Intrinsic Amplitude: 2.7 mV
Lead Channel Sensing Intrinsic Amplitude: 2.8 mV
Lead Channel Setting Pacing Amplitude: 1.25 V
Lead Channel Setting Pacing Amplitude: 1.875
Lead Channel Setting Pacing Pulse Width: 0.5 ms
Lead Channel Setting Sensing Sensitivity: 2 mV
Pulse Gen Model: 2272
Pulse Gen Serial Number: 3311922

## 2022-05-06 NOTE — Progress Notes (Signed)
Remote pacemaker transmission.   

## 2022-05-09 ENCOUNTER — Other Ambulatory Visit: Payer: Self-pay | Admitting: Physician Assistant

## 2022-06-01 ENCOUNTER — Other Ambulatory Visit: Payer: Self-pay

## 2022-06-01 NOTE — Telephone Encounter (Signed)
Called pt to inform him per Anderson Malta, RN, that pt needed to contact PCP for a refill on amoxicillin. Pt verbalized understanding.

## 2022-06-01 NOTE — Telephone Encounter (Signed)
Pt calling requesting a refill on Amoxicillin. Would Dr. Rayann Heman like to refill this medication for a dental procedure pt is having? Please specify the amount of tablets being dispense and how many refills, please. Thanks

## 2022-07-12 ENCOUNTER — Other Ambulatory Visit (HOSPITAL_COMMUNITY): Payer: Self-pay | Admitting: Urology

## 2022-07-12 ENCOUNTER — Other Ambulatory Visit: Payer: Self-pay | Admitting: Urology

## 2022-07-12 DIAGNOSIS — C61 Malignant neoplasm of prostate: Secondary | ICD-10-CM

## 2022-07-12 DIAGNOSIS — C7951 Secondary malignant neoplasm of bone: Secondary | ICD-10-CM

## 2022-07-27 ENCOUNTER — Ambulatory Visit (INDEPENDENT_AMBULATORY_CARE_PROVIDER_SITE_OTHER): Payer: Medicare Other

## 2022-07-27 DIAGNOSIS — I441 Atrioventricular block, second degree: Secondary | ICD-10-CM

## 2022-07-27 LAB — CUP PACEART REMOTE DEVICE CHECK
Battery Remaining Longevity: 92 mo
Battery Remaining Percentage: 74 %
Battery Voltage: 3.01 V
Brady Statistic AP VP Percent: 3.5 %
Brady Statistic AP VS Percent: 25 %
Brady Statistic AS VP Percent: 2.4 %
Brady Statistic AS VS Percent: 68 %
Brady Statistic RA Percent Paced: 25 %
Brady Statistic RV Percent Paced: 5.9 %
Date Time Interrogation Session: 20230906040013
Implantable Lead Implant Date: 20200622
Implantable Lead Implant Date: 20200622
Implantable Lead Location: 753859
Implantable Lead Location: 753860
Implantable Pulse Generator Implant Date: 20200622
Lead Channel Impedance Value: 380 Ohm
Lead Channel Impedance Value: 460 Ohm
Lead Channel Pacing Threshold Amplitude: 0.75 V
Lead Channel Pacing Threshold Amplitude: 1 V
Lead Channel Pacing Threshold Pulse Width: 0.4 ms
Lead Channel Pacing Threshold Pulse Width: 0.5 ms
Lead Channel Sensing Intrinsic Amplitude: 2 mV
Lead Channel Sensing Intrinsic Amplitude: 3.2 mV
Lead Channel Setting Pacing Amplitude: 1.25 V
Lead Channel Setting Pacing Amplitude: 1.75 V
Lead Channel Setting Pacing Pulse Width: 0.5 ms
Lead Channel Setting Sensing Sensitivity: 2 mV
Pulse Gen Model: 2272
Pulse Gen Serial Number: 3311922

## 2022-08-15 NOTE — Progress Notes (Signed)
Remote pacemaker transmission.   

## 2022-09-15 ENCOUNTER — Encounter (HOSPITAL_COMMUNITY)
Admission: RE | Admit: 2022-09-15 | Discharge: 2022-09-15 | Disposition: A | Discharge status: Home / Self Care | Payer: Medicare Other | Source: Ambulatory Visit | Attending: Urology | Admitting: Urology

## 2022-09-15 DIAGNOSIS — C7952 Secondary malignant neoplasm of bone marrow: Secondary | ICD-10-CM | POA: Diagnosis present

## 2022-09-15 DIAGNOSIS — C7951 Secondary malignant neoplasm of bone: Secondary | ICD-10-CM | POA: Insufficient documentation

## 2022-09-15 DIAGNOSIS — C61 Malignant neoplasm of prostate: Secondary | ICD-10-CM | POA: Insufficient documentation

## 2022-09-15 MED ORDER — TECHNETIUM TC 99M MEDRONATE IV KIT
20.0000 | PACK | Freq: Once | INTRAVENOUS | Status: AC | PRN
  Administered 2022-09-15: 20.8 via INTRAVENOUS

## 2022-09-29 LAB — COLOGUARD: COLOGUARD: POSITIVE — AB

## 2022-10-18 NOTE — Progress Notes (Signed)
Electrophysiology Office Note Date: 10/25/2022  ID:  Jared Chaney, Jared Chaney 1946/09/18, MRN [id number]  PCP: Rosemary Holms, NP Primary Cardiologist: Werner Lean, MD Electrophysiologist: Dr. Rayann Heman -> Dr. Quentin Ore  CC: Pacemaker follow-up  Jared Chaney is a 76 y.o. male seen today for Vickie Epley, MD for routine electrophysiology followup. Since last being seen in our clinic the patient reports doing OK overall. Has had some issues with his kidneys and calcium. Has intermittent edema. he denies chest pain, palpitations, dyspnea, PND, orthopnea, nausea, vomiting, dizziness, syncope, weight gain, or early satiety.   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  Past Medical History:  Diagnosis Date   Allergies    Aortic stenosis    s/p TAVR   AV block    s/p PPM   Back pain    Body mass index (BMI) 34.0-34.9, adult    Borderline diabetes    CKD (chronic kidney disease)    Diastolic dysfunction    Erectile dysfunction    Hypertension    Hypothyroidism    Iron deficiency anemia    Kidney cysts    LVH (left ventricular hypertrophy) due to hypertensive disease    Onychomycosis of toenail    OSA (obstructive sleep apnea)    not compliant with CPAP   Prostate cancer ([place])    Retinal vein occlusion    Vitamin D deficiency    Past Surgical History:  Procedure Laterality Date   ANKLE SURGERY     FOOT SURGERY Right    GASTRIC BY PASS     KNEE SURGERY     PACEMAKER IMPLANT Left 05/13/2019   SJM Assurity MRI model 2272 PPM implanted in [place] by Dr Emeline General for "AV block"   SKIN TRANSPLANTATION     TAVR      Current Outpatient Medications  Medication Sig Dispense Refill   amLODipine (NORVASC) 10 MG tablet Take 10 mg by mouth daily.     amoxicillin (AMOXIL) 500 MG tablet Take 500 mg by mouth as directed. Only piror to dental procedures     aspirin EC 81 MG tablet Take 81 mg by mouth daily. Swallow whole.      azithromycin (ZITHROMAX) 250 MG tablet Take 250 mg by mouth as needed (for protection against infections/bacteria).      benazepril-hydrochlorthiazide (LOTENSIN HCT) 20-25 MG tablet Take 0.5 tablets by mouth daily.     fluticasone (FLONASE) 50 MCG/ACT nasal spray Place 1 spray into the nose in the morning and at bedtime.     FLUTICASONE PROPIONATE EX Apply topically.     ipratropium (ATROVENT) 0.03 % nasal spray Place 2 sprays into both nostrils 2 (two) times daily.     levothyroxine (SYNTHROID) 75 MCG tablet Take 75 mcg by mouth daily before breakfast.     NUBEQA 300 MG tablet Take 600 mg by mouth 2 (two) times daily.     omeprazole (PRILOSEC) 20 MG capsule Take 20 mg by mouth daily.     rosuvastatin (CRESTOR) 10 MG tablet TAKE 1 TABLET BY MOUTH  DAILY 90 tablet 2   Selenium 200 MCG CAPS Take 1 capsule by mouth daily.     Specialty Vitamins Products [facility]) CAPS Take 1 tablet by mouth daily.     Specialty Vitamins Products (PROSTATE PO) Take 1 tablet by mouth daily.     fexofenadine (ALLEGRA) 180 MG tablet Take 180 mg by mouth as needed for allergies or rhinitis.  No current facility-administered medications for this visit.    Allergies:   Codeine   Social History: Social History   Socioeconomic History   Marital status: Widowed    Spouse name: Not on file   Number of children: Not on file   Years of education: Not on file   Highest education level: Not on file  Occupational History   Occupation: retired  Tobacco Use   Smoking status: Former    Packs/day: 0.50    Years: 24.00    Total pack years: 12.00    Types: Cigarettes    Start date: 32    Quit date: 1984    Years since quitting: 39.9   Smokeless tobacco: Never  Substance and Sexual Activity   Alcohol use: Not Currently    Comment: 6pk per week   Drug use: Never   Sexual activity: Not on file  Other Topics Concern   Not on file  Social History Narrative   Moved from [place] to [place] July 2021 to be near  his son   Widowed (2015)   3 biological children and 7 adopted   Retired from [employer]   Social Determinants of Radio broadcast assistant Strain: Not on Art therapist Insecurity: Not on file  Transportation Needs: Not on file  Physical Activity: Not on file  Stress: Not on file  Social Connections: Not on file  Intimate Partner Violence: Not on file    Family History: Family History  Problem Relation Age of Onset   Hypertension Mother    Heart attack Mother    Hypertension Father    Renal Disease Father    CVA Other      Review of Systems: All other systems reviewed and are otherwise negative except as noted above.  Physical Exam: Vitals:   10/25/22 0846  BP: 110/66  Pulse: 72  SpO2: 99%  Weight: 278 lb (126.1 kg)  Height: '6\' 3"'$  (1.905 m)     GEN- The patient is well appearing, alert and oriented x 3 today.   HEENT: normocephalic, atraumatic; sclera clear, conjunctiva pink; hearing intact; oropharynx clear; neck supple, no JVP Lymph- no cervical lymphadenopathy Lungs- Clear to ausculation bilaterally, normal work of breathing.  No wheezes, rales, rhonchi Heart- Regular  rate and rhythm, no murmurs, rubs or gallops, PMI not laterally displaced GI- soft, non-tender, non-distended, bowel sounds present, no hepatosplenomegaly Extremities- no clubbing or cyanosis. Trace peripheral edema; DP/PT/radial pulses 2+ bilaterally MS- no significant deformity or atrophy Skin- warm and dry, no rash or lesion; PPM pocket well healed Psych- euthymic mood, full affect Neuro- strength and sensation are intact  PPM Interrogation-  reviewed in detail today,  See PACEART report.  EKG:  EKG is ordered today. Personal review of ekg ordered today shows NSR at 66    Recent Labs: 03/02/2022: ALT 33; BUN 36; Creatinine, Ser 2.90; Hemoglobin 10.2; Platelets 143; Potassium 3.8; Sodium 141   Wt Readings from Last 3 Encounters:  10/25/22 278 lb (126.1 kg)  02/15/22 277 lb 9.6 oz (125.9 kg)   10/14/21 270 lb (122.5 kg)     Other studies Reviewed: Additional studies/ records that were reviewed today include: Previous EP office notes, Previous remote checks, Most recent labwork.   Assessment and Plan:  1. 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 HTN Stable on current regimen   3. Chronic diastolic CHF Volume status stable  4.  H/o TAVR Followed by Dr. Gasper Sells, stable by Echo 2021  5. OSA  Encouraged nightly CPAP   6. 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 Cardiac clearance for cataract removal OK to proceed with low to moderate risk given structural heart disease and CKD IV.  He is NOT dependent on his PPM.   Current medicines are reviewed at length with the patient today.     Disposition:   Follow up with Dr. Quentin Ore in 12 months    Signed, Shirley Friar, PA-C  10/25/2022 8:50 AM  [place] [address] [place] [address] [phone] (office) [phone] (fax)

## 2022-10-20 ENCOUNTER — Telehealth: Payer: Self-pay

## 2022-10-20 NOTE — Telephone Encounter (Signed)
Added preop clearance to upcoming appt notes.

## 2022-10-20 NOTE — Telephone Encounter (Signed)
..     Pre-operative Risk Assessment    Patient Name: Jared Chaney  DOB: Aug 28, 1946 MRN: [id number]      Request for Surgical Clearance    Procedure:   CATARACT EXTRACTION BY PE IOL-R [place]  Date of Surgery:  Clearance TBD                                 Surgeon:  DR Bretta Bang Surgeon's Group or Practice Name:  [facility] Phone number:  [phone] Fax number:  [phone]   Type of Clearance Requested:   - Medical  - Pharmacy:  Hold Aspirin , HOLD ASPRIN FOR 5-7 DAYS   Type of Anesthesia:   IV SEDATION   Additional requests/questions:    Gwenlyn Found   10/20/2022, 1:46 PM

## 2022-10-20 NOTE — Telephone Encounter (Signed)
Preoperative team:  Patient has upcoming office visit with Oda Kilts, PA-C on 10/25/2022. Please add preoperative cardiac evaluation to appointment note. I will defer cardiac evaluation to provider at that time and will remove them from preoperative pool.  Thank you!  Justice Britain. Ethyn Schetter, NP-C     10/20/2022, 2:37 PM [place] [address] Office [phone] Fax [phone]

## 2022-10-25 ENCOUNTER — Encounter: Payer: Self-pay | Admitting: Student

## 2022-10-25 ENCOUNTER — Ambulatory Visit: Payer: Medicare Other | Attending: Student | Admitting: Student

## 2022-10-25 VITALS — BP 110/66 | HR 72 | Ht 75.0 in | Wt 278.0 lb

## 2022-10-25 DIAGNOSIS — I441 Atrioventricular block, second degree: Secondary | ICD-10-CM | POA: Diagnosis not present

## 2022-10-25 DIAGNOSIS — I5032 Chronic diastolic (congestive) heart failure: Secondary | ICD-10-CM

## 2022-10-25 DIAGNOSIS — I10 Essential (primary) hypertension: Secondary | ICD-10-CM | POA: Diagnosis not present

## 2022-10-25 DIAGNOSIS — G4733 Obstructive sleep apnea (adult) (pediatric): Secondary | ICD-10-CM

## 2022-10-25 LAB — CUP PACEART INCLINIC DEVICE CHECK
Battery Remaining Longevity: 96 mo
Battery Voltage: 3.01 V
Brady Statistic RA Percent Paced: 25 %
Brady Statistic RV Percent Paced: 5.9 %
Date Time Interrogation Session: 20231205090129
Implantable Lead Connection Status: 753985
Implantable Lead Connection Status: 753985
Implantable Lead Implant Date: 20200622
Implantable Lead Implant Date: 20200622
Implantable Lead Location: 753859
Implantable Lead Location: 753860
Implantable Pulse Generator Implant Date: 20200622
Lead Channel Impedance Value: 362.5 Ohm
Lead Channel Impedance Value: 462.5 Ohm
Lead Channel Pacing Threshold Amplitude: 0.75 V
Lead Channel Pacing Threshold Amplitude: 1 V
Lead Channel Pacing Threshold Amplitude: 1 V
Lead Channel Pacing Threshold Pulse Width: 0.4 ms
Lead Channel Pacing Threshold Pulse Width: 0.4 ms
Lead Channel Pacing Threshold Pulse Width: 0.5 ms
Lead Channel Sensing Intrinsic Amplitude: 2.3 mV
Lead Channel Sensing Intrinsic Amplitude: 3.9 mV
Lead Channel Setting Pacing Amplitude: 1 V
Lead Channel Setting Pacing Amplitude: 1.875
Lead Channel Setting Pacing Pulse Width: 0.5 ms
Lead Channel Setting Sensing Sensitivity: 1.5 mV
Pulse Gen Model: 2272
Pulse Gen Serial Number: 3311922

## 2022-10-25 NOTE — Patient Instructions (Signed)
Medication Instructions:  Your physician recommends that you continue on your current medications as directed. Please refer to the Current Medication list given to you today.  *If you need a refill on your cardiac medications before your next appointment, please call your pharmacy*   Lab Work: None If you have labs (blood work) drawn today and your tests are completely normal, you will receive your results only by: [place] (if you have MyChart) OR A paper copy in the mail If you have any lab test that is abnormal or we need to change your treatment, we will call you to review the results.   Follow-Up: At [facility], you and your health needs are our priority.  As part of our continuing mission to provide you with exceptional heart care, we have created designated Provider Care Teams.  These Care Teams include your primary Cardiologist (physician) and Advanced Practice Providers (APPs -  Physician Assistants and Nurse Practitioners) who all work together to provide you with the care you need, when you need it.  Your next appointment:   1 year(s)  The format for your next appointment:   In Person  Provider:   Lars Mage, MD    Important Information About Sugar

## 2022-10-26 ENCOUNTER — Telehealth: Payer: Self-pay | Admitting: Student

## 2022-10-26 ENCOUNTER — Encounter: Payer: Self-pay | Admitting: Student

## 2022-10-26 ENCOUNTER — Ambulatory Visit (INDEPENDENT_AMBULATORY_CARE_PROVIDER_SITE_OTHER): Payer: Medicare Other

## 2022-10-26 DIAGNOSIS — I441 Atrioventricular block, second degree: Secondary | ICD-10-CM | POA: Diagnosis not present

## 2022-10-26 LAB — CUP PACEART REMOTE DEVICE CHECK
Battery Remaining Longevity: 93 mo
Battery Remaining Percentage: 72 %
Battery Voltage: 3.01 V
Brady Statistic AP VP Percent: 1 %
Brady Statistic AP VS Percent: 12 %
Brady Statistic AS VP Percent: 1 %
Brady Statistic AS VS Percent: 87 %
Brady Statistic RA Percent Paced: 9.9 %
Brady Statistic RV Percent Paced: 1 %
Date Time Interrogation Session: 20231206040041
Implantable Lead Connection Status: 753985
Implantable Lead Connection Status: 753985
Implantable Lead Implant Date: 20200622
Implantable Lead Implant Date: 20200622
Implantable Lead Location: 753859
Implantable Lead Location: 753860
Implantable Pulse Generator Implant Date: 20200622
Lead Channel Impedance Value: 360 Ohm
Lead Channel Impedance Value: 460 Ohm
Lead Channel Pacing Threshold Amplitude: 0.75 V
Lead Channel Pacing Threshold Amplitude: 0.875 V
Lead Channel Pacing Threshold Pulse Width: 0.4 ms
Lead Channel Pacing Threshold Pulse Width: 0.5 ms
Lead Channel Sensing Intrinsic Amplitude: 2 mV
Lead Channel Sensing Intrinsic Amplitude: 4.1 mV
Lead Channel Setting Pacing Amplitude: 1 V
Lead Channel Setting Pacing Amplitude: 1.875
Lead Channel Setting Pacing Pulse Width: 0.5 ms
Lead Channel Setting Sensing Sensitivity: 1.5 mV
Pulse Gen Model: 2272
Pulse Gen Serial Number: 3311922

## 2022-10-26 NOTE — Telephone Encounter (Signed)
Caller is following up on status of patient's clearance. 

## 2022-10-26 NOTE — Telephone Encounter (Signed)
EpicOnHand Encounter Open 10/26/2022 [facility], Satira Mccallum, [place] [facility]     All Notes   Progress Notes by Shirley Friar, PA-C at 10/26/2022 2:30 PM  Author: Shirley Friar, PA-C Author Type: Physician Assistant Certified Filed: 56/02/3328  2:32 PM  Note Status: Signed Cosign: Cosign Not Required Encounter Date: 10/26/2022  Editor: Annamaria Helling (Physician Assistant Certified)             ADDENDUM   Discussed with Dr. Gasper Sells.   Has been several years since pts TAVR.    Ok to hold ASA for Cataract procedure for 5-7 days.  Resume as soon as safe afterward.   Legrand Como [address] [place], [place]

## 2022-10-26 NOTE — Telephone Encounter (Signed)
I s/w Jared Chaney at [facility]. I explained the pt has been cleared however need input from [facility] on ok to hold ASA 5-7 days prior. Once I have amended notes from [place], I will fax new notes over to requesting office. Jared Chaney thanked me for the call to update their office.

## 2022-10-26 NOTE — Progress Notes (Signed)
ADDENDUM  Discussed with Dr. Gasper Sells.  Has been several years since pts TAVR.   Ok to hold ASA for Cataract procedure for 5-7 days.  Resume as soon as safe afterward.  Legrand Como [address] [place], [place]

## 2022-10-26 NOTE — Telephone Encounter (Signed)
I sent a message to Oda Kilts, [facility] who cleared the pt, however, I did not see any recommendations about holding ASA. I have asked PAC if ok to hold ASA 5-7 days prior to procedure if he will amend his ov note. I will be happy to re-fax new notes.

## 2022-11-18 NOTE — Progress Notes (Signed)
Remote pacemaker transmission.   

## 2022-11-24 ENCOUNTER — Telehealth: Payer: Self-pay | Admitting: *Deleted

## 2022-11-24 NOTE — Telephone Encounter (Signed)
    Pre-operative Risk Assessment    Patient Name: Jared Chaney  DOB: 1946/06/20 MRN: [id number]        Request for Surgical Clearance     Procedure:   CATARACT EXTRACTION BY PE IOL-L [place]   Date of Surgery:  Clearance TBD                                 Surgeon:  DR Bretta Bang Surgeon's Group or Practice Name:  [facility] Phone number:  [phone] Fax number:  [phone]   Type of Clearance Requested:   - Medical  - Pharmacy:  Hold Aspirin , HOLD ASPRIN FOR 7 DAYS BEFORE SURGERY   Type of Anesthesia:   IV SEDATION   Additional requests/questions:     Astrid Divine, [facility] 11/24/22

## 2022-11-24 NOTE — Telephone Encounter (Signed)
   Name: Jared Chaney  DOB: 20-Jun-1946  MRN: [id number]   Primary Cardiologist: Werner Lean, MD  Chart reviewed as part of pre-operative protocol coverage.  Jared Chaney was last seen on 10/25/22 by Oda Kilts, PA.  Since that day, Jared Chaney has done okay overall.  Per Oda Kilts, PA [name] is okay to proceed with low to moderate risk given structural heart disease and CKD IV.  He is NOT dependent on his PPM and is cleared to hold aspirin 5 to 7 days with instructions to resume as soon as safe afterwards.  I will route this recommendation to the requesting party via Epic fax function and remove from pre-op pool. Please call with questions.  Mable Fill, Marissa Nestle, NP 11/24/2022, 11:51 AM

## 2022-12-07 ENCOUNTER — Telehealth: Payer: Self-pay | Admitting: Cardiology

## 2022-12-07 NOTE — Telephone Encounter (Signed)
Pt is stating that his PCP was suppose to send in results for heart monitor that he had on for a week and he was told to call to see if you had received results and see if he needs to make appt. Please advise.

## 2022-12-07 NOTE — Telephone Encounter (Signed)
Spoke with the patient and advised that I do not see any heart monitor results scanned into his chart at this time but I will check our faxes when I return to the [facility] office tomorrow. Will call PCP if not seen. Patient verbalized understanding.

## 2022-12-08 NOTE — Telephone Encounter (Signed)
Spoke with patient's PCP office and requested monitor results.

## 2022-12-09 NOTE — Telephone Encounter (Signed)
Monitor results received and uploaded to patient's chart. Sent to Dr. Quentin Ore for review.

## 2022-12-21 NOTE — Telephone Encounter (Signed)
Jared Epley, MD  Bernestine Amass, RN Follow up as originally planned. Looks like Jonni Sanger recommended 1 year. His remote interrogations have been OK. Thanks, Lars Mage  Spoke with the patient and made him aware.

## 2023-01-25 ENCOUNTER — Ambulatory Visit: Payer: Medicare Other

## 2023-01-25 DIAGNOSIS — I441 Atrioventricular block, second degree: Secondary | ICD-10-CM

## 2023-01-25 LAB — CUP PACEART REMOTE DEVICE CHECK
Battery Remaining Longevity: 89 mo
Battery Remaining Percentage: 69 %
Battery Voltage: 3.01 V
Brady Statistic AP VP Percent: 1 %
Brady Statistic AP VS Percent: 26 %
Brady Statistic AS VP Percent: 1 %
Brady Statistic AS VS Percent: 71 %
Brady Statistic RA Percent Paced: 25 %
Brady Statistic RV Percent Paced: 1.7 %
Date Time Interrogation Session: 20240306040026
Implantable Lead Connection Status: 753985
Implantable Lead Connection Status: 753985
Implantable Lead Implant Date: 20200622
Implantable Lead Implant Date: 20200622
Implantable Lead Location: 753859
Implantable Lead Location: 753860
Implantable Pulse Generator Implant Date: 20200622
Lead Channel Impedance Value: 360 Ohm
Lead Channel Impedance Value: 480 Ohm
Lead Channel Pacing Threshold Amplitude: 0.875 V
Lead Channel Pacing Threshold Amplitude: 0.875 V
Lead Channel Pacing Threshold Pulse Width: 0.4 ms
Lead Channel Pacing Threshold Pulse Width: 0.5 ms
Lead Channel Sensing Intrinsic Amplitude: 2 mV
Lead Channel Sensing Intrinsic Amplitude: 3.6 mV
Lead Channel Setting Pacing Amplitude: 1.125
Lead Channel Setting Pacing Amplitude: 1.875
Lead Channel Setting Pacing Pulse Width: 0.5 ms
Lead Channel Setting Sensing Sensitivity: 1.5 mV
Pulse Gen Model: 2272
Pulse Gen Serial Number: 3311922

## 2023-02-06 NOTE — Progress Notes (Unsigned)
Office Visit    Patient Name: Jared Chaney Date of Encounter: 02/08/2023  Primary Care Provider:  Rosemary Holms, NP Primary Cardiologist:  Werner Lean, MD Primary Electrophysiologist: Vickie Epley, MD  Chief Complaint    Jared Chaney is a 77 y.o. male with PMH of aortic stenosis s/p TAVR, AV block s/p PPM 04/2019, CKD, HTN, OSA, prostate CA, IDA, HFpEF, RBBB, carotid artery disease, OSA (on CPAP) who presents today for 1 year follow-up.  Past Medical History    Past Medical History:  Diagnosis Date   Allergies    Aortic stenosis    s/p TAVR   AV block    s/p PPM   Back pain    Body mass index (BMI) 34.0-34.9, adult    Borderline diabetes    CKD (chronic kidney disease)    Diastolic dysfunction    Erectile dysfunction    Hypertension    Hypothyroidism    Iron deficiency anemia    Kidney cysts    LVH (left ventricular hypertrophy) due to hypertensive disease    Onychomycosis of toenail    OSA (obstructive sleep apnea)    not compliant with CPAP   Prostate cancer ([place])    Retinal vein occlusion    Vitamin D deficiency    Past Surgical History:  Procedure Laterality Date   ANKLE SURGERY     FOOT SURGERY Right    GASTRIC BY PASS     KNEE SURGERY     PACEMAKER IMPLANT Left 05/13/2019   SJM Assurity MRI model 2272 PPM implanted in [place] by Dr Emeline General for "AV block"   SKIN TRANSPLANTATION     TAVR      Allergies  Allergies  Allergen Reactions   Denosumab Other (See Comments)    Severe hypocalcemia   Codeine     Other reaction(s): Unknown    History of Present Illness    Jared Chaney is a 77 y.o. male with PMH of HFp severe AS s/p TAVR, HTN, RBBB, AVB s/p PPM and OSA on CPAP Presents today for 1 year follow up. He was last seen on 04/2021 by Richardson Dopp, PA. During visit patient described symptoms of exhaustion that occurs after exertional work.  He denied chest pain, SOB, syncope.  Patient was also having difficulty with tolerating  CPAP therapy due to history of prostate CA and frequent urination at night.  He was referred to Dr. Radford Pax for assistance with tolerating CPAP at night.  EXTR he was was seen 02/15/2022 for follow-up and during visit patient's blood pressures were in the 0000000 systolically and blood sugars were being monitored.  He noted some dizziness and possible dehydration with plan to  decrease Benzepril-HCTZ if persisting.  Since last being seen in the office patient reports he has been experiencing increased fatigue since around October of last year.  Since this time he has felt as if simple task caused him to feel exhausted.  He reports that his oncologist gave him a Prolia injection which caused his calcium to deplete and required IV replacement.  He notes that since his injection he has felt the increased fatigue.  He denies any additional supplements or new medications since his injection.  He is planning to reestablish care with a new cardiologist and electrophysiologist in [place]. He is currently being followed by his PCP in [place] and most recent calcium was almost within normal limits.  His blood pressure today is well-controlled at 122/66 and heart rate was  72 bpm.  He is tolerating his medications currently without any additional adverse reactions.  Pacemaker function report showed normal lead function with no abnormalities noted.  He is euvolemic on exam today with chronic lower extremity edema noted.  Patient denies chest pain, palpitations, dyspnea, PND, orthopnea, nausea, vomiting, dizziness, syncope, edema, weight gain, or early satiety.   Home Medications    Current Outpatient Medications  Medication Sig Dispense Refill   amLODipine (NORVASC) 5 MG tablet Take 5 mg by mouth daily.     amoxicillin (AMOXIL) 500 MG tablet Take 500 mg by mouth as directed. Only piror to dental procedures     aspirin EC 81 MG tablet Take 81 mg by mouth daily. Swallow whole.     benazepril-hydrochlorthiazide (LOTENSIN  HCT) 20-25 MG tablet Take 0.5 tablets by mouth daily.     calcitRIOL (ROCALTROL) 0.25 MCG capsule Take 0.25 mcg by mouth daily.     Calcium-Magnesium-Vitamin D (CITRACAL CALCIUM+D) 600-40-500 MG-MG-UNIT TB24 Take by mouth.  Take 1,200 mg by mouth.     FARXIGA 10 MG TABS tablet Take by mouth. Take 10 mg by mouth Once Daily.     fexofenadine (ALLEGRA) 180 MG tablet Take 180 mg by mouth as needed for allergies or rhinitis.     fluticasone (FLONASE) 50 MCG/ACT nasal spray Place 1 spray into the nose in the morning and at bedtime.     ipratropium (ATROVENT) 0.03 % nasal spray Place 2 sprays into both nostrils 2 (two) times daily.     levothyroxine (SYNTHROID) 75 MCG tablet Take 75 mcg by mouth daily before breakfast.     NUBEQA 300 MG tablet Take 600 mg by mouth 2 (two) times daily.     omeprazole (PRILOSEC) 20 MG capsule Take 20 mg by mouth daily.     rosuvastatin (CRESTOR) 10 MG tablet TAKE 1 TABLET BY MOUTH  DAILY 90 tablet 2   Selenium 200 MCG CAPS Take 1 capsule by mouth daily.     Specialty Vitamins Products [facility]) CAPS Take 1 tablet by mouth daily.     Specialty Vitamins Products (PROSTATE PO) Take 1 tablet by mouth daily.     azithromycin (ZITHROMAX) 250 MG tablet Take 250 mg by mouth as needed (for protection against infections/bacteria).  (Patient not taking: Reported on 02/08/2023)     oxybutynin (DITROPAN) 5 MG tablet Take 5 mg by mouth at bedtime as needed. (Patient not taking: Reported on 02/08/2023)     No current facility-administered medications for this visit.     Review of Systems  Please see the history of present illness.    (+) Fatigue (+) Shortness of breath with heavy exertion  All other systems reviewed and are otherwise negative except as noted above.  Physical Exam    Wt Readings from Last 3 Encounters:  02/08/23 278 lb (126.1 kg)  10/25/22 278 lb (126.1 kg)  02/15/22 277 lb 9.6 oz (125.9 kg)   VS: Vitals:   02/08/23 1012  BP: 122/66  Pulse: 72  SpO2:  96%  ,Body mass index is 33.84 kg/m.  Constitutional:      Appearance: Healthy appearance. Not in distress.  Neck:     Vascular: JVD normal.  Pulmonary:     Effort: Pulmonary effort is normal.     Breath sounds: No wheezing. No rales. Diminished in the bases Cardiovascular:     Normal rate. Regular rhythm. Normal S1. Normal S2.      Murmurs: There is no murmur.  Edema:  Peripheral edema absent.  Abdominal:     Palpations: Abdomen is soft non tender. There is no hepatomegaly.  Skin:    General: Skin is warm and dry.  Neurological:     General: No focal deficit present.     Mental Status: Alert and oriented to person, place and time.     Cranial Nerves: Cranial nerves are intact.  EKG/LABS/ Recent Cardiac Studies    ECG personally reviewed by me today -none completed today    Lab Results  Component Value Date   WBC 5.1 03/02/2022   HGB 10.2 (L) 03/02/2022   HCT 30.0 (L) 03/02/2022   MCV 104.9 (H) 03/02/2022   PLT 143 (L) 03/02/2022   Lab Results  Component Value Date   CREATININE 2.90 (H) 03/02/2022   BUN 36 (H) 03/02/2022   NA 141 03/02/2022   K 3.8 03/02/2022   CL 111 03/02/2022   CO2 21 (L) 03/02/2022   Lab Results  Component Value Date   ALT 33 03/02/2022   AST 41 03/02/2022   ALKPHOS 62 03/02/2022   BILITOT 0.7 03/02/2022   Lab Results  Component Value Date   CHOL 73 (L) 08/09/2021   HDL 28 (L) 08/09/2021   LDLCALC 29 08/09/2021   TRIG 70 08/09/2021   CHOLHDL 2.6 08/09/2021    No results found for: "HGBA1C"  Cardiac Studies & Procedures   CARDIAC CATHETERIZATION  CARDIAC CATHETERIZATION 08/10/2020   CARDIAC CATHETERIZATION  CARDIAC CATHETERIZATION 08/10/2020                Assessment & Plan    1.  HFpEF/Fatigue: -Previous 2D echo completed -Today patient reports that he is experiencing increased fatigue with little to no energy since his previous visit 01/23/2020 with EF noted at 50-55% with mild concentric LVH and no RWMA with  well-seated prosthetic aortic valve. -Today patient is euvolemic on examination and denies CP and SOB.   -We will have him repeat 2D echo for further evaluation of aortic valve and LV function. -Continue current Farxiga 10 mg, benazepril-HCTZ 10-12.5 mg -Low sodium diet, fluid restriction <2L, and daily weights encouraged. Educated to contact our office for weight gain of 2 lbs overnight or 5 lbs in one week.   -We will check BMET and TSH for further evaluation of new onset fatigue   2.  Non Rheumatic Aortic valve stenosis: -S/p TAVR, completed in [place] with normal valve gradient per echo 2021. -Patient advised of the importance of SBE prophylaxis for any dental procedures. -He reports new onset fatigue since previous visit and we will have him repeat 2D echo for further evaluation.   3.  Permanent pacemaker: -Followed by  Dr. Quentin Ore and implanted in [place] June 2020 -Normal Paceart report with no abnormalities noted   4.  Obstructive sleep apnea: -He was referred to Dr. Radford Pax for assistance with tolerating CPAP.  He reports that currently he is using a oral appliance that has reduced his snoring.  He is also experiencing less sleepiness throughout the day. -He is completing a home sleep study for further evaluation at this time.   5.  Essential hypertension: -Blood pressure today was 122/66.  Advised to continue to document his blood pressures daily and continue low-sodium heart healthy diet. -Continue amlodipine 10 mg daily -Benazepril -HCTZ as noted above.   6.  Obesity: -BMI is 34.45 -He is currently [employer] is dancing with his new friend.     Disposition: Follow-up with Werner Lean, MD or  APP as needed    Medication Adjustments/Labs and Tests Ordered: Current medicines are reviewed at length with the patient today.  Concerns regarding medicines are outlined above.   Signed, Mable Fill, Marissa Nestle, NP 02/08/2023, 10:35 AM [facility]  Note:  This document was prepared using Dragon voice recognition software and may include unintentional dictation errors.

## 2023-02-08 ENCOUNTER — Ambulatory Visit: Payer: Medicare Other | Attending: Nurse Practitioner | Admitting: Nurse Practitioner

## 2023-02-08 ENCOUNTER — Encounter: Payer: Self-pay | Admitting: Nurse Practitioner

## 2023-02-08 ENCOUNTER — Telehealth: Payer: Self-pay

## 2023-02-08 VITALS — BP 122/66 | HR 72 | Ht 76.0 in | Wt 278.0 lb

## 2023-02-08 DIAGNOSIS — I350 Nonrheumatic aortic (valve) stenosis: Secondary | ICD-10-CM

## 2023-02-08 DIAGNOSIS — Z8679 Personal history of other diseases of the circulatory system: Secondary | ICD-10-CM

## 2023-02-08 DIAGNOSIS — I5032 Chronic diastolic (congestive) heart failure: Secondary | ICD-10-CM | POA: Diagnosis not present

## 2023-02-08 DIAGNOSIS — E6609 Other obesity due to excess calories: Secondary | ICD-10-CM | POA: Diagnosis not present

## 2023-02-08 DIAGNOSIS — I10 Essential (primary) hypertension: Secondary | ICD-10-CM

## 2023-02-08 DIAGNOSIS — G4733 Obstructive sleep apnea (adult) (pediatric): Secondary | ICD-10-CM

## 2023-02-08 NOTE — Patient Instructions (Addendum)
Medication Instructions:  Your physician recommends that you continue on your current medications as directed. Please refer to the Current Medication list given to you today. *If you need a refill on your cardiac medications before your next appointment, please call your pharmacy*   Lab Work: TODAY-BNP & TSH If you have labs (blood work) drawn today and your tests are completely normal, you will receive your results only by: [place] (if you have MyChart) OR A paper copy in the mail If you have any lab test that is abnormal or we need to change your treatment, we will call you to review the results.   Testing/Procedures: Your physician has requested that you have an echocardiogram. Echocardiography is a painless test that uses sound waves to create images of your heart. It provides your doctor with information about the size and shape of your heart and how well your heart's chambers and valves are working. This procedure takes approximately one hour. There are no restrictions for this procedure. Please do NOT wear cologne, perfume, aftershave, or lotions (deodorant is allowed). Please arrive 15 minutes prior to your appointment time. WITH A PROVIDER CLOSER TO YOU   Follow-Up: At [facility], you and your health needs are our priority.  As part of our continuing mission to provide you with exceptional heart care, we have created designated Provider Care Teams.  These Care Teams include your primary Cardiologist (physician) and Advanced Practice Providers (APPs -  Physician Assistants and Nurse Practitioners) who all work together to provide you with the care you need, when you need it.  We recommend signing up for the patient portal called "MyChart".  Sign up information is provided on this After Visit Summary.  MyChart is used to connect with patients for Virtual Visits (Telemedicine).  Patients are able to view lab/test results, encounter notes, upcoming appointments, etc.   Non-urgent messages can be sent to your provider as well.   To learn more about what you can do with MyChart, go to [email or web address].    Your next appointment:   PRN (LOOKING TO FIND A DOCTOR CLOSER TO HOME)  Provider:   Werner Lean, MD   Other Instructions

## 2023-02-08 NOTE — Telephone Encounter (Signed)
Contacted the patients pcp office and made them aware that the patient would like to see a cardiologist closer to his home and requested they order his Echocardiogram.  The receptionist took a message and stated he would send it the clinical staff.

## 2023-02-08 NOTE — Telephone Encounter (Signed)
-----   Message from Marylu Lund., NP sent at 02/08/2023 12:37 PM EDT ----- Please contact Mr. Beveridge and advise him to contact his PCP regarding repeat 2D echo.  Routing information for his PCP was not available.  Please let me know if you have any questions.

## 2023-02-08 NOTE — Telephone Encounter (Signed)
Spoke with the patient and asked him to contact his pcp to request that they order his echo.  Patient states he goes to [facility] and voiced understanding.

## 2023-02-09 ENCOUNTER — Telehealth: Payer: Self-pay | Admitting: Internal Medicine

## 2023-02-09 LAB — PRO B NATRIURETIC PEPTIDE: NT-Pro BNP: 1351 pg/mL — ABNORMAL HIGH (ref 0–376)

## 2023-02-09 LAB — TSH: TSH: 3.23 u[IU]/mL (ref 0.450–4.500)

## 2023-02-09 NOTE — Telephone Encounter (Signed)
Patient is calling to follow up on lab results.

## 2023-02-09 NOTE — Telephone Encounter (Signed)
Patient has been notified directly of lab results; all questions, if any, were answered. Patient voiced understanding.

## 2023-03-01 NOTE — Progress Notes (Signed)
Remote pacemaker transmission.   

## 2023-04-07 IMAGING — US US RENAL
1 series · 14 of 25 positions shown · non-contrast
Comparison: None.

CLINICAL DATA: Chronic kidney disease.

EXAM:
RENAL / URINARY TRACT ULTRASOUND COMPLETE

[Series 1: us renal · 0.21mm/px · 14 of 38 slices shown]
[im 1/38]
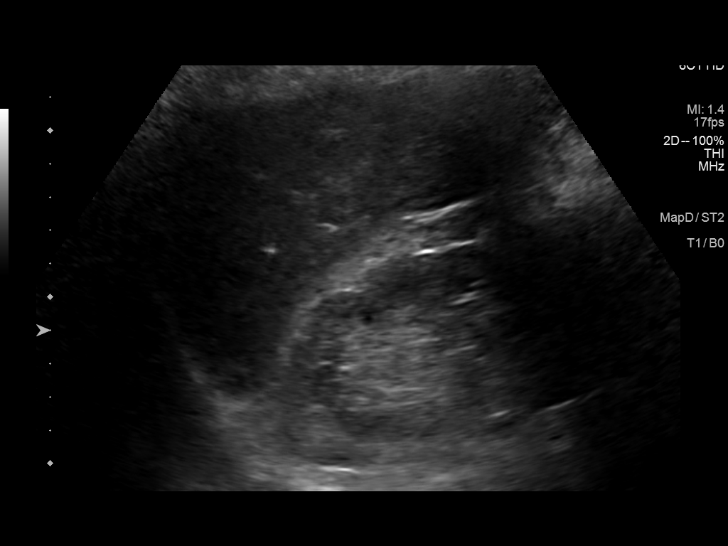
[im 4/38]
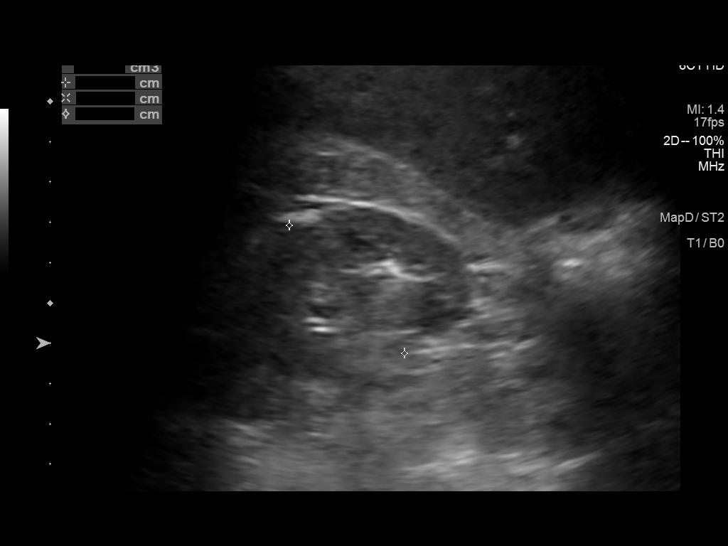
[im 7/38]
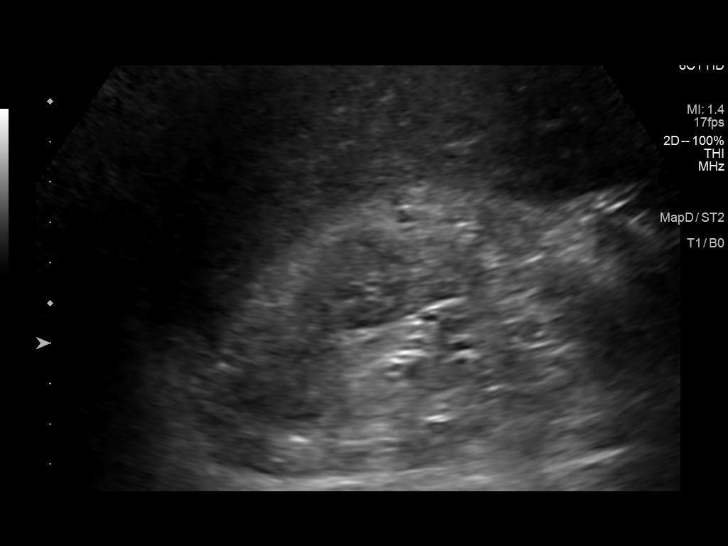
[im 10/38]
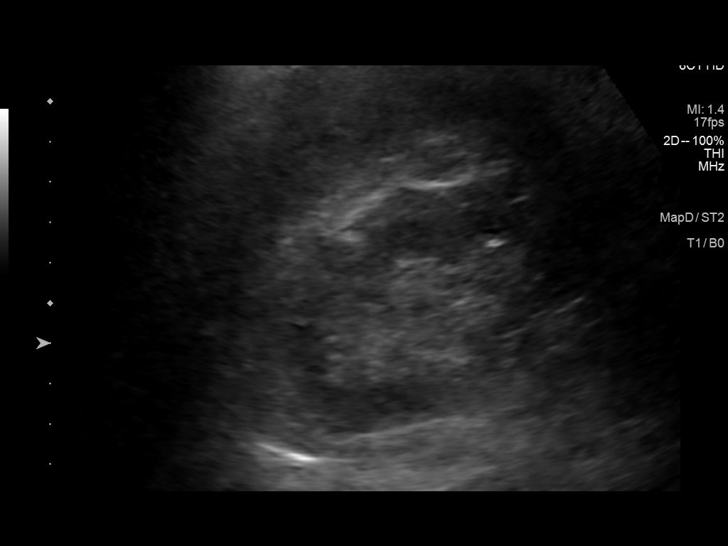
[im 13/38]
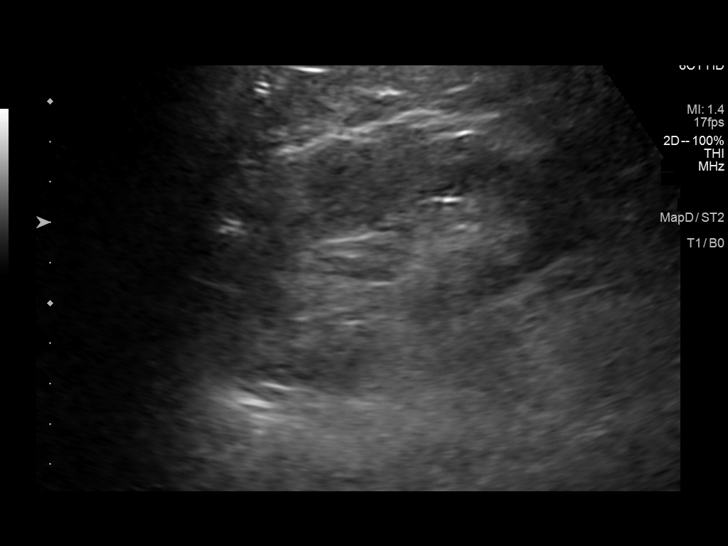
[im 14/38]
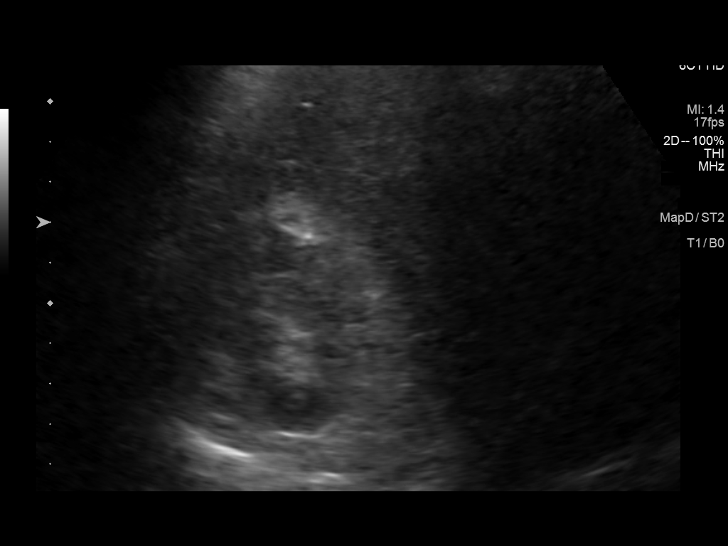
[im 17/38]
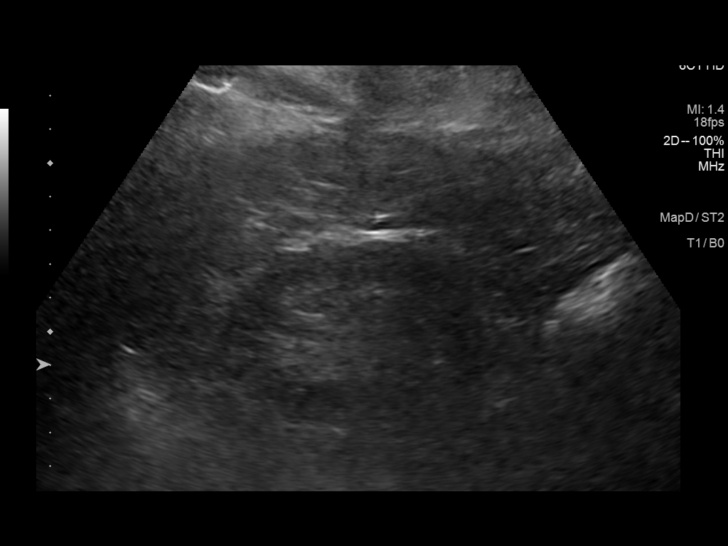
[im 21/38]
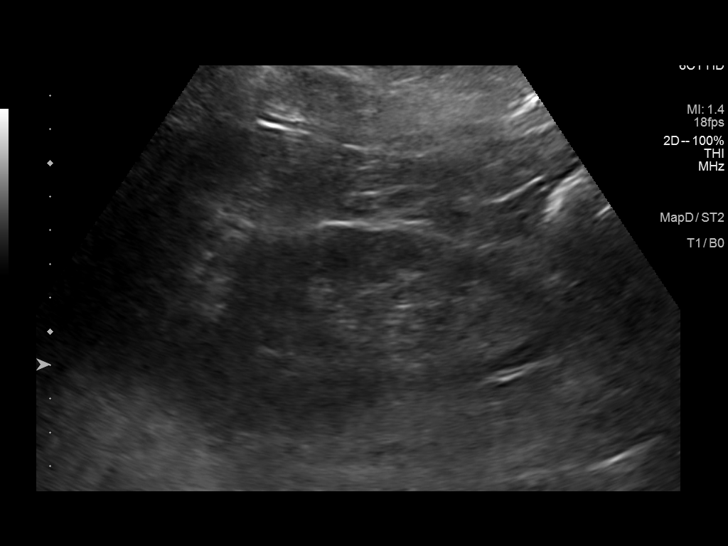
[im 24/38]
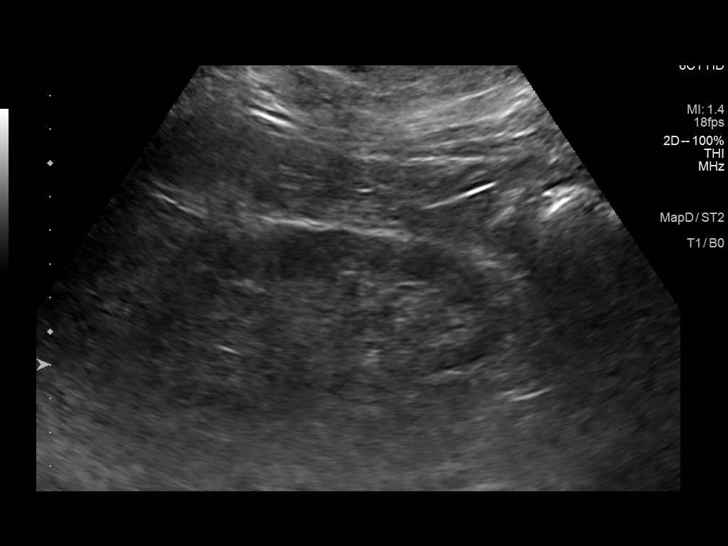
[im 25/38]
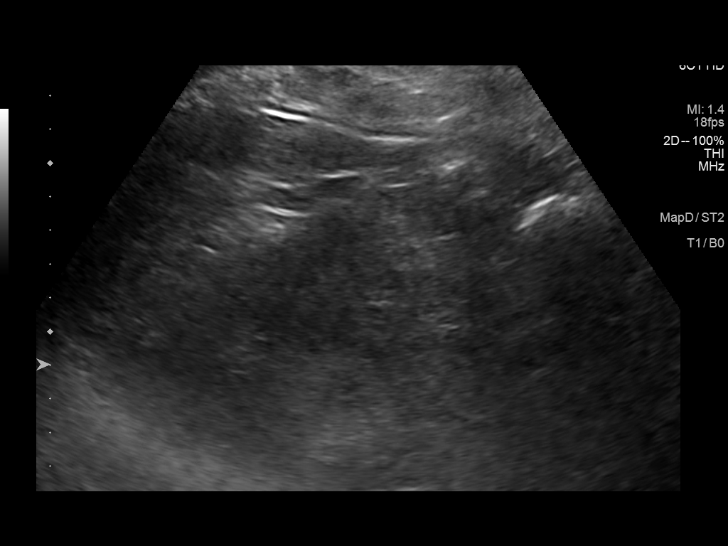
[im 28/38]
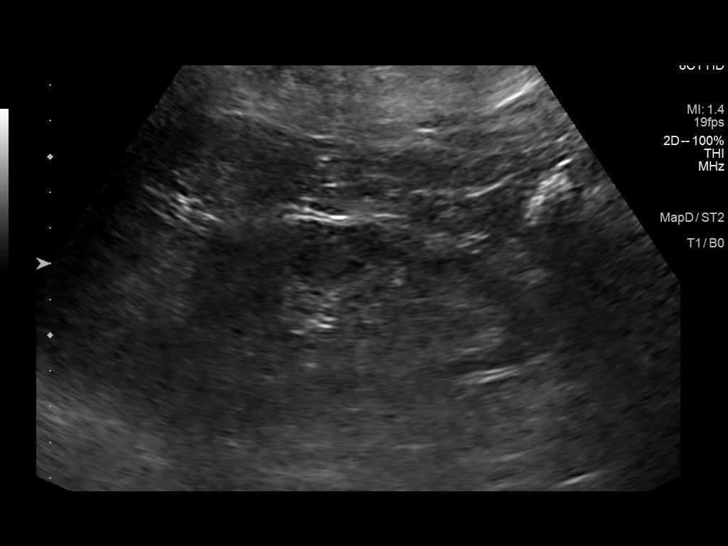
[im 31/38]
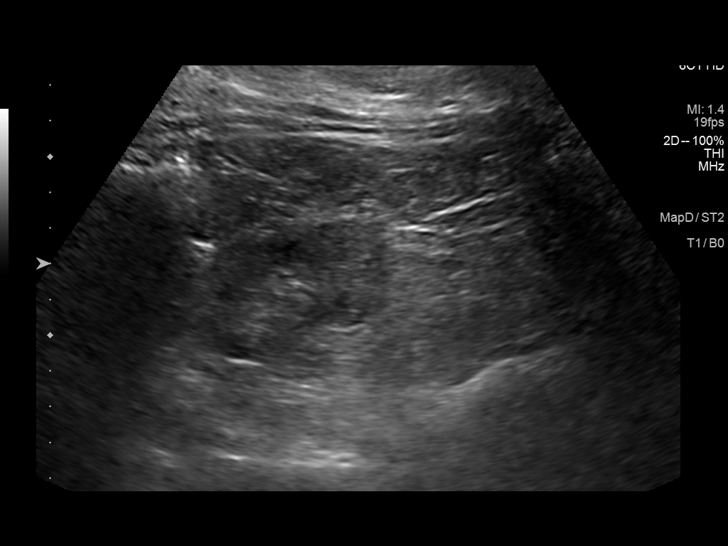
[im 34/38]
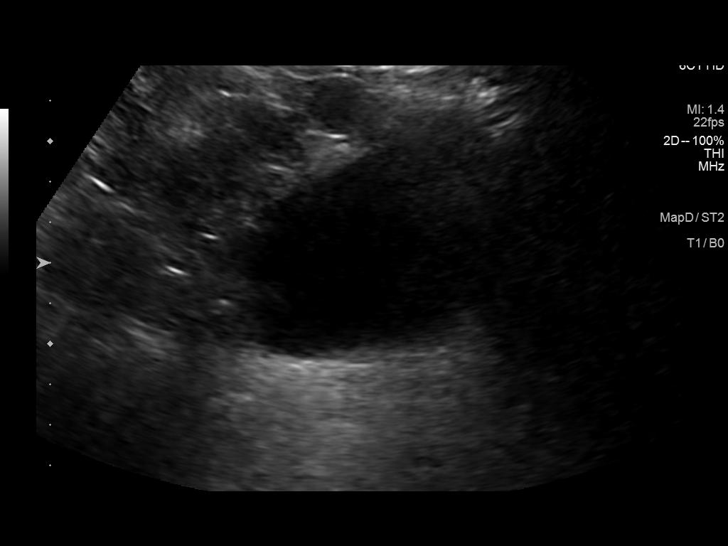
[im 38/38]
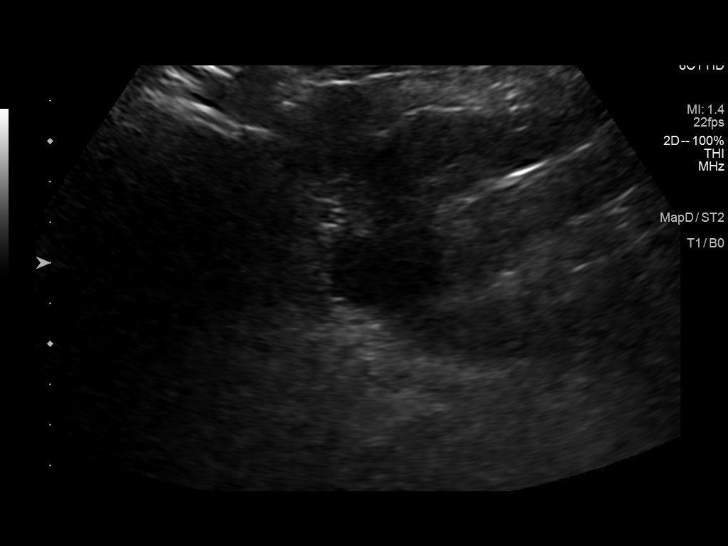

[14 of 25 positions shown; findings below may reference images not displayed]

FINDINGS: Right Kidney:

Renal measurements: 8.7 x 3.9 x 4.3 cm = volume: 76 mL. Normal
echogenicity. No hydronephrosis or shadowing stone.

Left Kidney:

Renal measurements: 11.0 x 5.4 x 9.3 cm = volume: 133 mL. Normal
echogenicity. No hydronephrosis or shadowing stone.

Bladder:

Appears normal for degree of bladder distention.

Other:

None.
IMPRESSION: Unremarkable renal ultrasound.

## 2023-04-26 ENCOUNTER — Ambulatory Visit (INDEPENDENT_AMBULATORY_CARE_PROVIDER_SITE_OTHER): Payer: Medicare Other

## 2023-04-26 DIAGNOSIS — I441 Atrioventricular block, second degree: Secondary | ICD-10-CM

## 2023-04-28 LAB — CUP PACEART REMOTE DEVICE CHECK
Battery Remaining Longevity: 86 mo
Battery Remaining Percentage: 67 %
Battery Voltage: 3.01 V
Brady Statistic AP VP Percent: 2 %
Brady Statistic AP VS Percent: 29 %
Brady Statistic AS VP Percent: 1.2 %
Brady Statistic AS VS Percent: 67 %
Brady Statistic RA Percent Paced: 28 %
Brady Statistic RV Percent Paced: 3.2 %
Date Time Interrogation Session: 20240606184647
Implantable Lead Connection Status: 753985
Implantable Lead Connection Status: 753985
Implantable Lead Implant Date: 20200622
Implantable Lead Implant Date: 20200622
Implantable Lead Location: 753859
Implantable Lead Location: 753860
Implantable Pulse Generator Implant Date: 20200622
Lead Channel Impedance Value: 390 Ohm
Lead Channel Impedance Value: 530 Ohm
Lead Channel Pacing Threshold Amplitude: 0.75 V
Lead Channel Pacing Threshold Amplitude: 0.75 V
Lead Channel Pacing Threshold Pulse Width: 0.4 ms
Lead Channel Pacing Threshold Pulse Width: 0.5 ms
Lead Channel Sensing Intrinsic Amplitude: 2 mV
Lead Channel Sensing Intrinsic Amplitude: 3.6 mV
Lead Channel Setting Pacing Amplitude: 1 V
Lead Channel Setting Pacing Amplitude: 1.75 V
Lead Channel Setting Pacing Pulse Width: 0.5 ms
Lead Channel Setting Sensing Sensitivity: 1.5 mV
Pulse Gen Model: 2272
Pulse Gen Serial Number: 3311922

## 2023-05-22 NOTE — Progress Notes (Signed)
Remote pacemaker transmission.   

## 2023-07-26 ENCOUNTER — Ambulatory Visit (INDEPENDENT_AMBULATORY_CARE_PROVIDER_SITE_OTHER): Payer: Medicare Other

## 2023-07-26 DIAGNOSIS — I441 Atrioventricular block, second degree: Secondary | ICD-10-CM

## 2023-07-27 LAB — CUP PACEART REMOTE DEVICE CHECK
Battery Remaining Longevity: 82 mo
Battery Remaining Percentage: 65 %
Battery Voltage: 3.01 V
Brady Statistic AP VP Percent: 3.1 %
Brady Statistic AP VS Percent: 31 %
Brady Statistic AS VP Percent: 2.5 %
Brady Statistic AS VS Percent: 62 %
Brady Statistic RA Percent Paced: 31 %
Brady Statistic RV Percent Paced: 5.7 %
Date Time Interrogation Session: 20240905013536
Implantable Lead Connection Status: 753985
Implantable Lead Connection Status: 753985
Implantable Lead Implant Date: 20200622
Implantable Lead Implant Date: 20200622
Implantable Lead Location: 753859
Implantable Lead Location: 753860
Implantable Pulse Generator Implant Date: 20200622
Lead Channel Impedance Value: 350 Ohm
Lead Channel Impedance Value: 480 Ohm
Lead Channel Pacing Threshold Amplitude: 0.875 V
Lead Channel Pacing Threshold Amplitude: 1 V
Lead Channel Pacing Threshold Pulse Width: 0.4 ms
Lead Channel Pacing Threshold Pulse Width: 0.5 ms
Lead Channel Sensing Intrinsic Amplitude: 1.5 mV
Lead Channel Sensing Intrinsic Amplitude: 3.1 mV
Lead Channel Setting Pacing Amplitude: 1.25 V
Lead Channel Setting Pacing Amplitude: 1.875
Lead Channel Setting Pacing Pulse Width: 0.5 ms
Lead Channel Setting Sensing Sensitivity: 1.5 mV
Pulse Gen Model: 2272
Pulse Gen Serial Number: 3311922

## 2023-08-08 NOTE — Progress Notes (Signed)
Remote pacemaker transmission.   

## 2023-10-21 IMAGING — CT CT HEAD W/O CM
4 series · 14 of 47 positions shown, 16 images · non-contrast
Comparison: None.

CLINICAL DATA: Visual changes.



[Series 3: head without ax · axial · non-contrast · 0.32mm/px · z∈[-86,+32]mm · 7 of 34 slices shown, 9 images]
[im 5/34  brain]
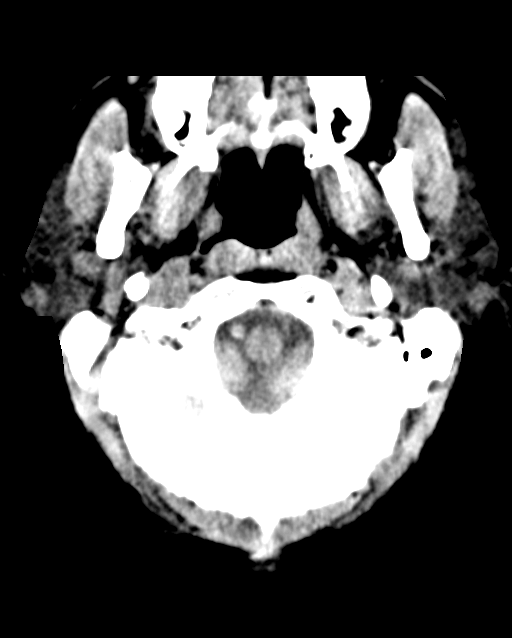
[im 5/34  bone]
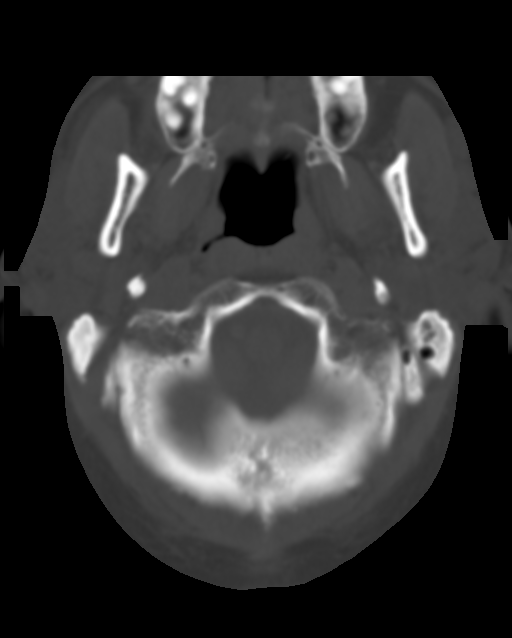
[im 9/34  brain]
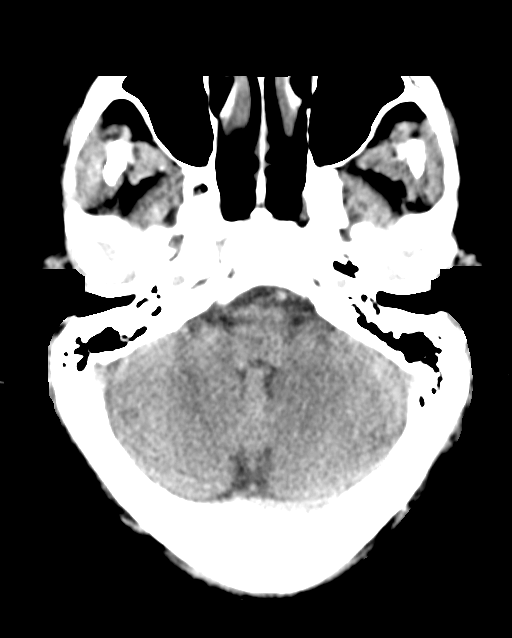
[im 13/34  brain]
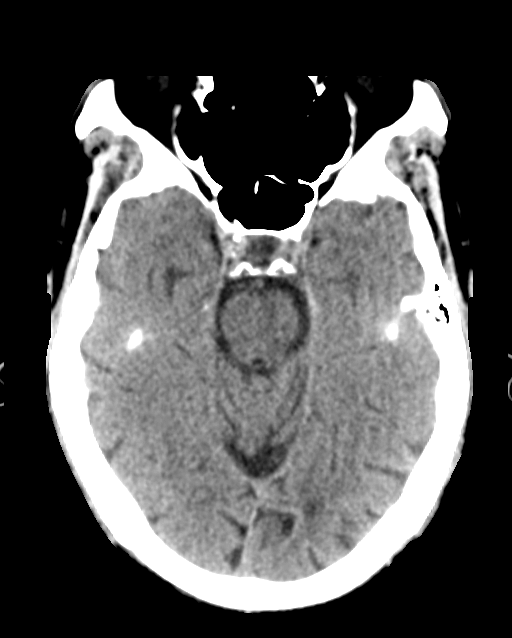
[im 17/34  brain]
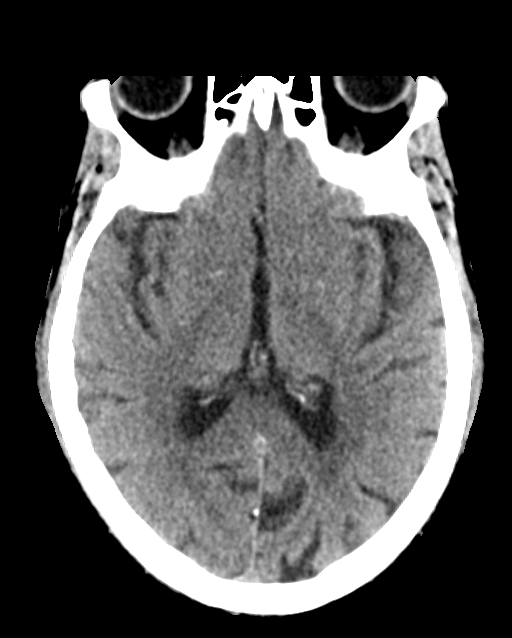
[im 21/34  brain]
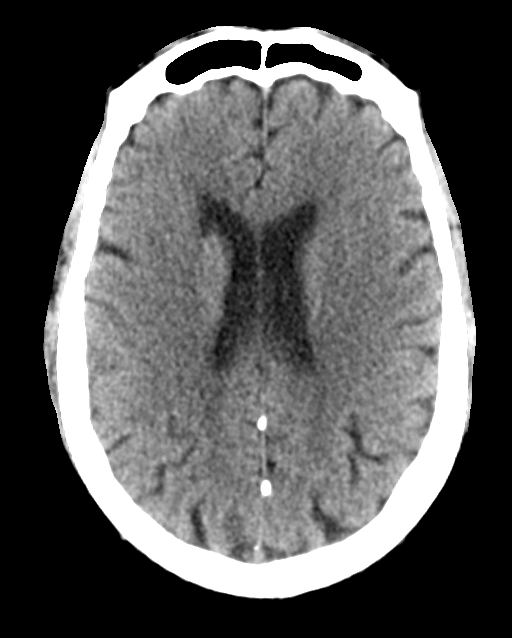
[im 21/34  bone]
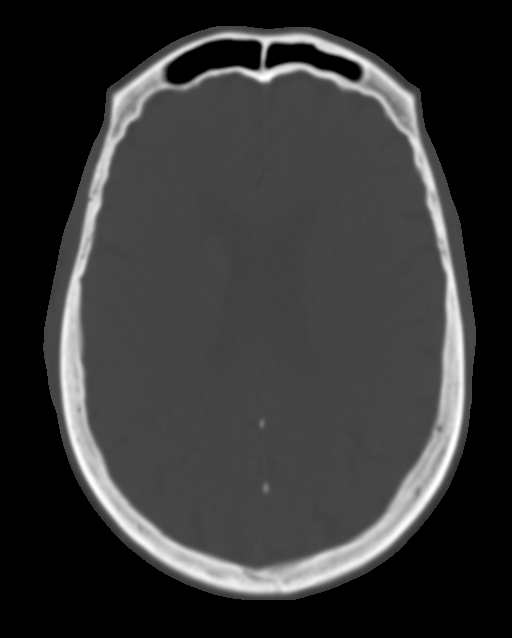
[im 25/34  brain]
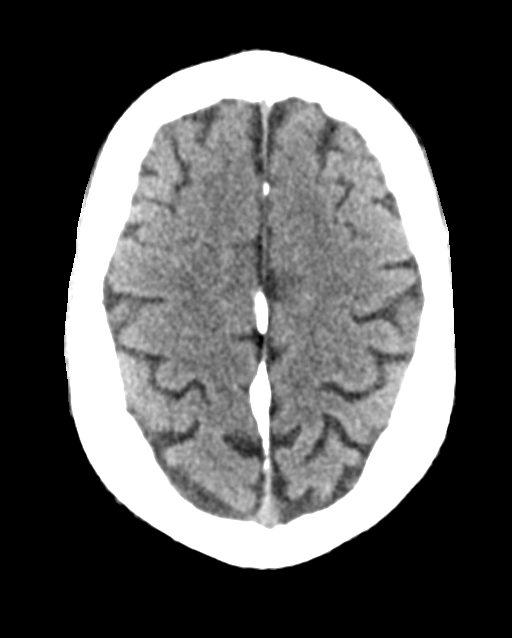
[im 29/34  brain]
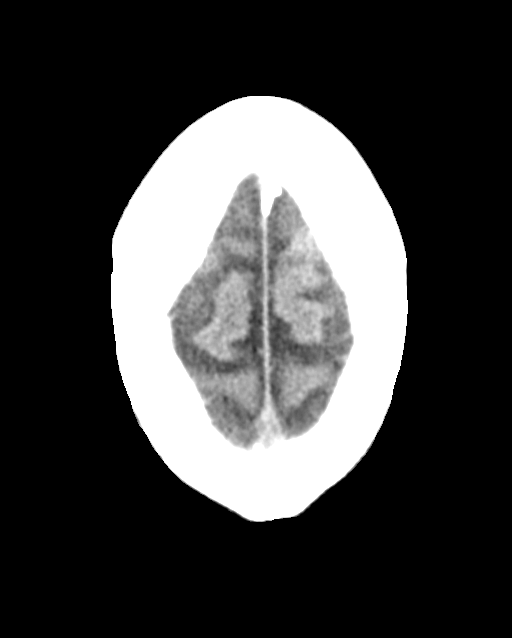

[Series 5: ax head bone · axial · 0.32mm/px · 1 of 83 slices shown]
[im 9/83  bone]
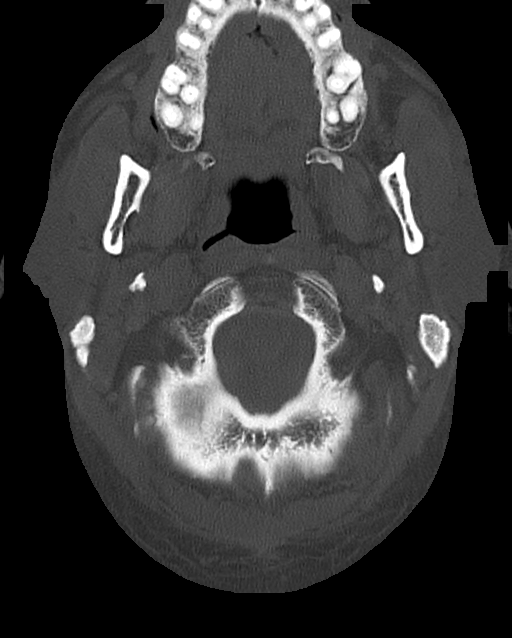

[Series 6: head without cor · coronal · non-contrast · 0.32mm/px · 3 of 70 slices shown]
[im 24/70  brain]
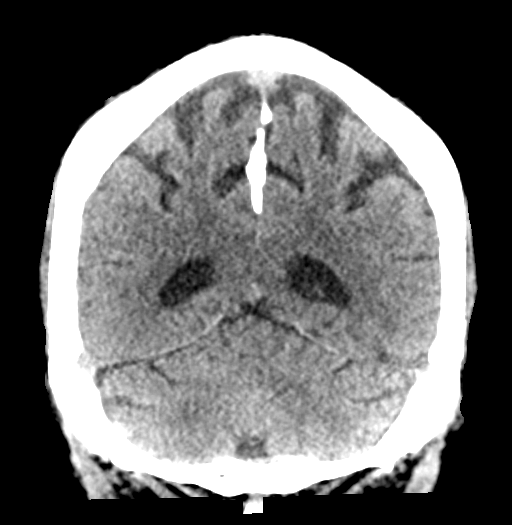
[im 31/70  brain]
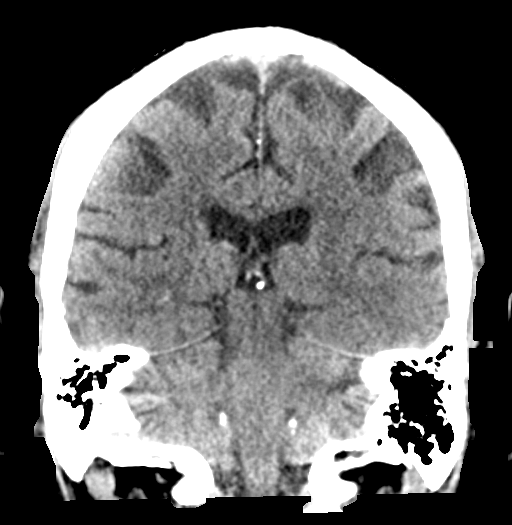
[im 39/70  brain]
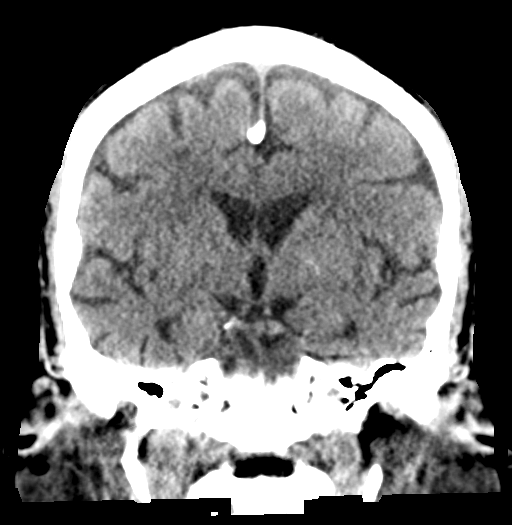

[Series 7: head without sag · sagittal · non-contrast · 0.31mm/px · 3 of 56 slices shown]
[im 19/56  brain]
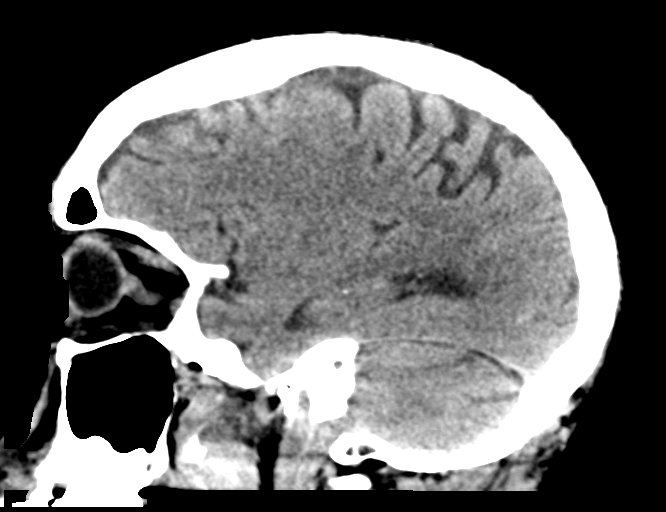
[im 28/56  brain]
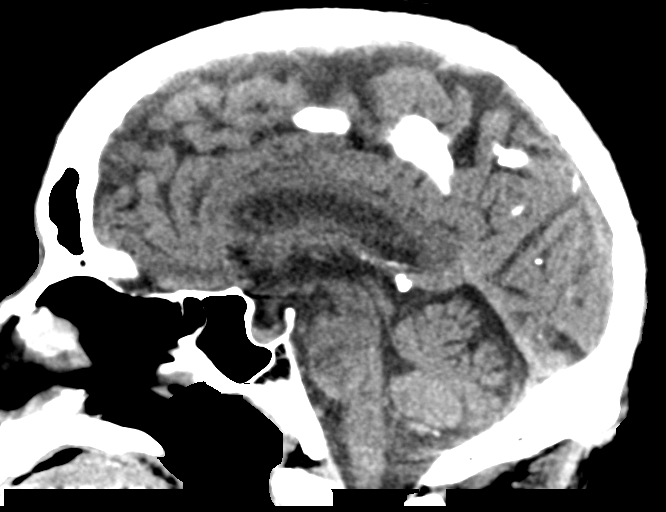
[im 37/56  brain]
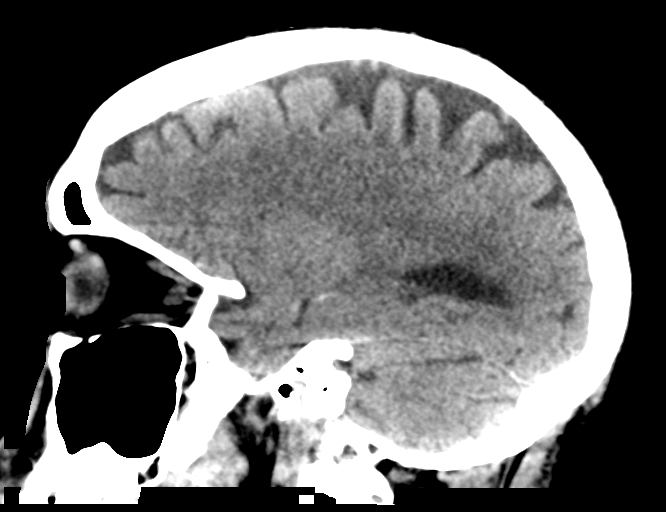

[14 of 47 positions shown; findings below may reference images not displayed]

FINDINGS: Brain: No evidence of acute infarction, hemorrhage, hydrocephalus,
extra-axial collection or mass lesion/mass effect.

Vascular: No hyperdense vessel or unexpected calcification.

Skull: Normal. Negative for fracture or focal lesion.

Sinuses/Orbits: No acute finding.

Other: None.
IMPRESSION: No acute intracranial abnormality seen.

## 2023-10-25 ENCOUNTER — Ambulatory Visit: Payer: Medicare Other

## 2023-10-25 DIAGNOSIS — I441 Atrioventricular block, second degree: Secondary | ICD-10-CM

## 2023-10-26 LAB — CUP PACEART REMOTE DEVICE CHECK
Battery Remaining Longevity: 79 mo
Battery Remaining Percentage: 63 %
Battery Voltage: 3.01 V
Brady Statistic AP VP Percent: 2.6 %
Brady Statistic AP VS Percent: 27 %
Brady Statistic AS VP Percent: 2 %
Brady Statistic AS VS Percent: 67 %
Brady Statistic RA Percent Paced: 27 %
Brady Statistic RV Percent Paced: 4.7 %
Date Time Interrogation Session: 20241205031606
Implantable Lead Connection Status: 753985
Implantable Lead Connection Status: 753985
Implantable Lead Implant Date: 20200622
Implantable Lead Implant Date: 20200622
Implantable Lead Location: 753859
Implantable Lead Location: 753860
Implantable Pulse Generator Implant Date: 20200622
Lead Channel Impedance Value: 360 Ohm
Lead Channel Impedance Value: 490 Ohm
Lead Channel Pacing Threshold Amplitude: 0.875 V
Lead Channel Pacing Threshold Amplitude: 0.875 V
Lead Channel Pacing Threshold Pulse Width: 0.4 ms
Lead Channel Pacing Threshold Pulse Width: 0.5 ms
Lead Channel Sensing Intrinsic Amplitude: 2 mV
Lead Channel Sensing Intrinsic Amplitude: 3.6 mV
Lead Channel Setting Pacing Amplitude: 1.125
Lead Channel Setting Pacing Amplitude: 1.875
Lead Channel Setting Pacing Pulse Width: 0.5 ms
Lead Channel Setting Sensing Sensitivity: 1.5 mV
Pulse Gen Model: 2272
Pulse Gen Serial Number: 3311922

## 2023-10-27 ENCOUNTER — Telehealth: Payer: Self-pay

## 2023-10-27 NOTE — Telephone Encounter (Signed)
Device alert for HVR Event occurred 12/6 @ 02:31, duration 23sec, HR 178 EGM consistent with short V-A - route to triage LA, CVRS  Prior pt of our clinic. Last seen by AT in December '23. Has since moved to a different clinic. Saw an EP provider in July '24 that noted he will be taking over remotes. We still have his remotes though. 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 Pt was symptomatic--epigastric and CP that woke him from his sleep. Lasted an hour. Accompanied by dizziness. Took tums. Has since completely resolved. Sent to AT for advisement

## 2023-10-27 NOTE — Telephone Encounter (Signed)
Attempted to call pts new clinic x 2. Pt going on cruise x 7 days tomorrow. Advised pt to call and make clinic aware of what was seen on device. Given ED precautions for new worsening CP SOB and syncope. Provided DC phone # if he needs [place].

## 2023-11-06 ENCOUNTER — Telehealth: Payer: Self-pay

## 2023-11-06 NOTE — Telephone Encounter (Signed)
Dr. Nicanor Bake office in [place] [phone] will follow patients pacemaker.  Spoke to Missy at this office who I advised patient is not longer followed in our office. Advised his pacemaker had an alert on it and needs to be followed up and also remotes transferred into their office. States she will pass the message along to Dr. Doristine Church nurse to follow up.   Spoke to patient to advise he needs to call the office follow up with office to see if he needs to be seen. Pt voiced understanding and agreeable.

## 2023-11-08 NOTE — Telephone Encounter (Signed)
I have released the patient in [facility] and marked the patient inactive in [facility].

## 2024-01-24 ENCOUNTER — Ambulatory Visit: Payer: Medicare Other

## 2024-04-24 ENCOUNTER — Ambulatory Visit: Payer: Medicare Other

## 2024-11-05 ENCOUNTER — Other Ambulatory Visit (HOSPITAL_COMMUNITY): Payer: Self-pay | Admitting: Urology

## 2024-11-05 DIAGNOSIS — C61 Malignant neoplasm of prostate: Secondary | ICD-10-CM

## 2024-11-28 ENCOUNTER — Ambulatory Visit (HOSPITAL_COMMUNITY)
Admission: RE | Admit: 2024-11-28 | Discharge: 2024-11-28 | Disposition: A | Source: Ambulatory Visit | Attending: Urology | Admitting: Urology

## 2024-11-28 ENCOUNTER — Ambulatory Visit (HOSPITAL_COMMUNITY)
Admission: RE | Admit: 2024-11-28 | Discharge: 2024-11-28 | Disposition: A | Discharge status: Home / Self Care | Source: Ambulatory Visit | Attending: Urology | Admitting: Urology

## 2024-11-28 ENCOUNTER — Other Ambulatory Visit (HOSPITAL_COMMUNITY): Payer: Self-pay | Admitting: Urology

## 2024-11-28 ENCOUNTER — Encounter (HOSPITAL_COMMUNITY): Payer: Self-pay

## 2024-11-28 DIAGNOSIS — C61 Malignant neoplasm of prostate: Secondary | ICD-10-CM | POA: Insufficient documentation

## 2024-11-28 LAB — POCT I-STAT CREATININE: Creatinine, Ser: 2.3 mg/dL — ABNORMAL HIGH (ref 0.61–1.24)

## 2024-11-28 MED ORDER — TECHNETIUM TC 99M MEDRONATE IV KIT
20.0000 | PACK | Freq: Once | INTRAVENOUS | Status: AC | PRN
  Administered 2024-11-28: 19 via INTRAVENOUS
